# Patient Record
Sex: Male | Born: 1955 | Race: White | Hispanic: No | Marital: Married | Smoking: Never smoker
Health system: Southern US, Community
[De-identification: ages and names within clinical notes are randomized; demographics above are authoritative.]

## PROBLEM LIST (undated history)

## (undated) DIAGNOSIS — R112 Nausea with vomiting, unspecified: Secondary | ICD-10-CM

## (undated) DIAGNOSIS — T8859XA Other complications of anesthesia, initial encounter: Secondary | ICD-10-CM

## (undated) DIAGNOSIS — K219 Gastro-esophageal reflux disease without esophagitis: Secondary | ICD-10-CM

## (undated) DIAGNOSIS — I10 Essential (primary) hypertension: Secondary | ICD-10-CM

## (undated) HISTORY — PX: BACK SURGERY: SHX140

---
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
## 1999-11-28 ENCOUNTER — Encounter: Payer: Self-pay | Admitting: *Deleted

## 1999-11-28 ENCOUNTER — Encounter: Admission: RE | Admit: 1999-11-28 | Discharge: 1999-11-28 | Payer: Self-pay | Admitting: *Deleted

## 2004-11-13 ENCOUNTER — Ambulatory Visit: Payer: Self-pay | Admitting: Internal Medicine

## 2004-11-27 ENCOUNTER — Ambulatory Visit: Payer: Self-pay | Admitting: Internal Medicine

## 2008-11-12 ENCOUNTER — Encounter: Admission: RE | Admit: 2008-11-12 | Discharge: 2008-11-12 | Payer: Self-pay | Admitting: Family Medicine

## 2008-11-27 ENCOUNTER — Ambulatory Visit (HOSPITAL_BASED_OUTPATIENT_CLINIC_OR_DEPARTMENT_OTHER): Admission: RE | Admit: 2008-11-27 | Discharge: 2008-11-27 | Payer: Self-pay | Admitting: Urology

## 2009-11-19 ENCOUNTER — Encounter (INDEPENDENT_AMBULATORY_CARE_PROVIDER_SITE_OTHER): Payer: Self-pay | Admitting: *Deleted

## 2010-06-04 ENCOUNTER — Telehealth: Payer: Self-pay | Admitting: Internal Medicine

## 2010-11-11 NOTE — Letter (Signed)
Summary: Colonoscopy Letter  [facility]  [address] [place], [place] [address]   Phone: [phone]  Fax: [phone]      November 19, 2009 MRN: [id number]   [facility] [address] [place], [place]  [address]   Dear Zachary Wade,   According to your medical record, it is time for you to schedule a Colonoscopy. The American Cancer Society recommends this procedure as a method to detect early colon cancer. Patients with a family history of colon cancer, or a personal history of colon polyps or inflammatory bowel disease are at increased risk.  This letter has beeen generated based on the recommendations made at the time of your procedure. If you feel that in your particular situation this may no longer apply, please contact our office.  Please call our office at [phone] to schedule this appointment or to update your records at your earliest convenience.  Thank you for cooperating with [place] to provide you with the very best care possible.   Sincerely,  Zachary Wade, M.D.  [facility] [phone]

## 2010-11-11 NOTE — Progress Notes (Signed)
Summary: Schedule Colonoscopy  Phone Note Outgoing Call Call back at [facility] Phone [phone]   Summary of Call: called pt he has no regular insurance and if his situation changed he will call back because he knows that it is very important to have his colonoscopy done. Initial call taken by: Harlow Mares CMA [facility]),  June 04, 2010 9:04 AM

## 2011-01-27 LAB — POCT HEMOGLOBIN-HEMACUE: Hemoglobin: 16.3 g/dL (ref 13.0–17.0)

## 2011-02-24 NOTE — Op Note (Signed)
NAME:  Zachary Wade, Zachary Wade                ACCOUNT NO.:  [id number]   MEDICAL RECORD NO.:  [id number]          PATIENT TYPE:  AMB   LOCATION:  [facility]                         FACILITY:  [facility]   PHYSICIAN:  Heloise Purpura, MD      DATE OF BIRTH:  12/18/1955   DATE OF PROCEDURE:  11/27/2008  DATE OF DISCHARGE:                               OPERATIVE REPORT   PREOPERATIVE DIAGNOSIS:  Right ureteral calculus.   POSTOPERATIVE DIAGNOSIS:  Right ureteral calculus.   PROCEDURES:  1. Cystoscopy.  2. Right retrograde pyelography.  3. Right ureteroscopy with laser lithotripsy and stone removal.   ANESTHESIA:  LMA.   INDICATIONS:  [name] is a 55 year old gentleman who presented to his  primary care physician with right-sided flank pain and microscopic  hematuria.  He was found to have a 5-mm distal right ureteral calculus.  He was given a trial spontaneous passage and medical expulsion therapy.  He failed this treatment and, therefore, elected to proceed with  ureteroscopic stone removal.  The potential risks, complications, and  alternative treatment options associated with the above procedure were  discussed in detail, and informed consent was obtained.   DESCRIPTION OF PROCEDURE:  The patient was taken to the operating room  and a general anesthetic was administered.  He was given preoperative  antibiotics, placed in the dorsal lithotomy position, and prepped and  draped in the usual sterile fashion.  Next a preoperative time-out was  performed.  Cystourethroscopy was then performed which demonstrated a  normal anterior and posterior urethra.  The bladder was inspected with  the ureteral orifices noted in the normal anatomic positions  bilaterally.  A systematic survey of the bladder revealed no evidence of  any bladder stones, tumors, or other mucosal pathology.  Attention  turned to the right ureteral orifice which was cannulated with a 6-  French ureteral catheter.  Omnipaque contrast was  injected which  demonstrated a filling defect in the distal right ureter consistent with  the patient's known calculus.  No other filling defects or abnormalities  of the ureter or renal collecting system were identified.  A 0.038  sensor guidewire was then advanced past the stone and up into the renal  pelvis under fluoroscopic guidance.  The short semi-rigid ureteroscope  was then advanced next to the wire into the distal right ureter without  the need for ureteral dilation.  The stone was identified and the 365-  micron laser fiber was inserted through the ureteroscope.  Utilizing the  holmium laser on a setting of 0.8 joules and at a frequency of 6 Hz, the  stone was fragmented.  The stone fragments were then removed with a  nitinol basket and were sent for stone analysis.  Reinspection on  ureteroscopy after stone fragments were removed revealed no remaining  sizable fragments.  Based on the fact that the procedure was very  straight-forward, it was felt that a  ureteral stent would not be necessary.  Therefore, the wire was removed.  All remaining stone fragments were removed from the bladder and the  bladder was emptied.  The patient tolerated the procedure well and  without complications.  He was able to be awakened and transferred to  the recovery unit in satisfactory condition.      Heloise Purpura, MD  Electronically Signed     LB/MEDQ  D:  11/27/2008  T:  11/27/2008  Job:  [id number]

## 2012-10-12 HISTORY — PX: SPINE SURGERY: SHX786

## 2013-08-17 ENCOUNTER — Encounter: Payer: Self-pay | Admitting: Internal Medicine

## 2013-09-26 ENCOUNTER — Ambulatory Visit: Payer: Self-pay

## 2013-09-27 ENCOUNTER — Ambulatory Visit: Payer: Self-pay | Admitting: Physical Therapy

## 2013-09-28 ENCOUNTER — Encounter: Payer: Self-pay | Admitting: Internal Medicine

## 2013-10-11 ENCOUNTER — Encounter: Payer: Self-pay | Admitting: Internal Medicine

## 2014-02-22 ENCOUNTER — Ambulatory Visit: Payer: BC Managed Care – PPO

## 2014-02-22 ENCOUNTER — Ambulatory Visit (INDEPENDENT_AMBULATORY_CARE_PROVIDER_SITE_OTHER): Payer: BC Managed Care – PPO | Admitting: Family Medicine

## 2014-02-22 VITALS — BP 126/84 | HR 76 | Temp 97.9°F | Resp 16 | Ht 68.5 in | Wt 162.8 lb

## 2014-02-22 DIAGNOSIS — M79642 Pain in left hand: Secondary | ICD-10-CM

## 2014-02-22 DIAGNOSIS — M79609 Pain in unspecified limb: Secondary | ICD-10-CM

## 2014-02-22 NOTE — Patient Instructions (Signed)
Let's keep an eye on this problem for a few days.  Try taking ibuprofen 400 mg three times a day for 5 days or so.  If you continue to have symptoms please let me or Dr. Amanda PeaGramig know.    Your x-ray shows some arthritis in your hand but no acute findings.

## 2014-02-22 NOTE — Progress Notes (Signed)
[facility] [address], [place] [id number] [phone]  Date:  02/22/2014   Name:  Zachary MaywoodMichael K Wade   DOB:  [birth year]   MRN:  [id number]  PCP:  Alva GarnetSHELTON,KIMBERLY R., MD    Chief Complaint: Joint Swelling   History of Present Illness:  Zachary Wade is a 58 y.o. very pleasant male patient who presents with the following:  He has a problem with his left hand today for the last  3 days,  It hurts to press on the area He is able to swing a golf club,   Never had this in the past, NKI Otherwise he feels ok.  He is generally very healthy  He is not aware of any family histoyr of gout  There are no active problems to display for this patient.   History reviewed. No pertinent past medical history.  Past Surgical History  Procedure Laterality Date  . Appendectomy  1967  . Spine surgery  2014    C5-C7    History  Substance Use Topics  . Smoking status: Never Smoker   . Smokeless tobacco: Not on file  . Alcohol Use: Not on file    History reviewed. No pertinent family history.  Allergies no known allergies  Medication list has been reviewed and updated.  No current outpatient prescriptions on file prior to visit.   No current facility-administered medications on file prior to visit.    Review of Systems:  As per HPI- otherwise negative.   Physical Examination: Filed Vitals:   02/22/14 0828  BP: 126/84  Pulse: 76  Temp: 97.9 F (36.6 C)  Resp: 16   Filed Vitals:   02/22/14 0828  Height: 5' 8.5" (1.74 m)  Weight: 162 lb 12.8 oz (73.846 kg)   Body mass index is 24.39 kg/(m^2). Ideal Body Weight: Weight in (lb) to have BMI = 25: 166.5  GEN: WDWN, NAD, Non-toxic, A & O x 3, looks well HEENT: Atraumatic, Normocephalic. Neck supple. No masses, No LAD. Ears and Nose: No external deformity. CV: RRR, No M/G/R. No JVD. No thrill. No extra heart sounds. PULM: CTA B, no wheezes, crackles, rhonchi. No retractions. No resp. distress.  No accessory muscle use. EXTR: No c/c/e Left hand: there is minimal swelling and tenderness at the 2nd MCP, on the radial side.  There is a possible small cyst palpable under the skin. No pain with movement of the joints, no weakness or lack of ROM, no heat NEURO Normal gait.  PSYCH: Normally interactive. Conversant. Not depressed or anxious appearing.  Calm demeanor.   [facility] reading (PRIMARY) by  Dr. Patsy Lageropland Left hand: negative for any acute abnormality.  Some degenerative change especially at 1st carpal/ metacarpal joint  LEFT HAND - COMPLETE 3+ VIEW  COMPARISON: None.  FINDINGS: No fracture. No dislocation.  There is a small cyst or possibly an erosion, along the radial margin at the base of the proximal phalanx of the index finger. Remaining metacarpophalangeal joints and the interphalangeal joints are unremarkable. Joint space narrowing, mild subchondral sclerosis and cystic change and small marginal osteophytes are noted at the trapezium first metacarpal articulation consistent with osteoarthritis. Soft tissues are unremarkable.  IMPRESSION: 1. No fracture or acute finding. 2. Small peripheral cyst versus a possible marginal erosion at the radial base of the proximal phalanx of the index finger. No other abnormality at this joint. 3. 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  At this point no evidence of dangerous etiology such as a joint infection.  He will use an anti- inflammatory on a scheduled basis for a few days. If he continues to have any trouble will plan to follow-up with Dr. Amanda PeaGramig who is his hand surgeon  Signed Abbe AmsterdamJessica Fitzgerald Dunne, MD

## 2014-02-25 ENCOUNTER — Encounter: Payer: Self-pay | Admitting: Family Medicine

## 2016-06-12 ENCOUNTER — Other Ambulatory Visit: Payer: Self-pay | Admitting: Urology

## 2016-06-12 NOTE — Progress Notes (Signed)
Pt is being scheduled for preop appt. Please place surgical orders in epic. Thanks.  

## 2016-06-25 ENCOUNTER — Encounter (HOSPITAL_COMMUNITY): Payer: Self-pay

## 2016-06-29 ENCOUNTER — Ambulatory Visit: Admit: 2016-06-29 | Payer: Self-pay | Admitting: Urology

## 2016-06-29 SURGERY — CYSTOURETEROSCOPY, WITH RETROGRADE PYELOGRAM AND STENT INSERTION
Anesthesia: General | Laterality: Left

## 2018-07-11 ENCOUNTER — Other Ambulatory Visit (HOSPITAL_COMMUNITY): Payer: Self-pay | Admitting: Cardiology

## 2018-07-11 DIAGNOSIS — R9430 Abnormal result of cardiovascular function study, unspecified: Secondary | ICD-10-CM

## 2018-07-11 DIAGNOSIS — R55 Syncope and collapse: Secondary | ICD-10-CM

## 2018-07-21 ENCOUNTER — Ambulatory Visit (HOSPITAL_COMMUNITY)
Admission: RE | Admit: 2018-07-21 | Discharge: 2018-07-21 | Disposition: A | Discharge status: Home / Self Care | Payer: BLUE CROSS/BLUE SHIELD | Source: Ambulatory Visit | Attending: Cardiology | Admitting: Cardiology

## 2018-07-21 DIAGNOSIS — R9430 Abnormal result of cardiovascular function study, unspecified: Secondary | ICD-10-CM | POA: Insufficient documentation

## 2018-07-21 DIAGNOSIS — R55 Syncope and collapse: Secondary | ICD-10-CM | POA: Insufficient documentation

## 2018-07-21 MED ORDER — METOPROLOL TARTRATE 5 MG/5ML IV SOLN
INTRAVENOUS | Status: AC
  Administered 2018-07-21: 5 mg via INTRAVENOUS
  Filled 2018-07-21: qty 20

## 2018-07-21 MED ORDER — NITROGLYCERIN 0.4 MG SL SUBL
0.8000 mg | SUBLINGUAL_TABLET | Freq: Once | SUBLINGUAL | Status: AC
  Administered 2018-07-21: 0.8 mg via SUBLINGUAL
  Filled 2018-07-21: qty 25

## 2018-07-21 MED ORDER — NITROGLYCERIN 0.4 MG SL SUBL
SUBLINGUAL_TABLET | SUBLINGUAL | Status: AC
  Filled 2018-07-21: qty 1

## 2018-07-21 MED ORDER — NITROGLYCERIN 0.4 MG SL SUBL
SUBLINGUAL_TABLET | SUBLINGUAL | Status: AC
  Administered 2018-07-21: 0.8 mg via SUBLINGUAL
  Filled 2018-07-21: qty 2

## 2018-07-21 MED ORDER — IOPAMIDOL (ISOVUE-370) INJECTION 76%
100.0000 mL | Freq: Once | INTRAVENOUS | Status: AC | PRN
  Administered 2018-07-21: 80 mL via INTRAVENOUS

## 2018-07-21 MED ORDER — METOPROLOL TARTRATE 5 MG/5ML IV SOLN
5.0000 mg | INTRAVENOUS | Status: DC | PRN
  Administered 2018-07-21: 5 mg via INTRAVENOUS
  Filled 2018-07-21: qty 5

## 2019-12-20 ENCOUNTER — Ambulatory Visit: Payer: Self-pay | Admitting: Surgery

## 2019-12-20 NOTE — H&P (Signed)
History of Present Illness Zachary Wade. Adelle Zachar MD; 12/20/2019 5:58 PM) The patient is a 64 year old male who presents with an inguinal hernia. PCP - [facility]: left inguinal hernia  This is a 64 year old male in reasonably good health who presents with recent history of left groin pain. The patient was bending over and felt a pop in his left groin. He has had some discomfort in this area. Subsequently this has developed into a bulge in the left groin. He remains reducible. He has not noticed any change in his bowel movements. He does not have any pain or discomfort in the right groin or in the umbilicus. He self referred for surgical evaluation.  The patient has had previous cervical spine surgery. He states that when he had general anesthesia last time, he had significant problems with urinary retention. He is quite concerned about this.    Problem List/Past Medical Zachary Hazard K. 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 Zachary Lucien, MD; 12/20/2019 5:58 PM) LORazepam (1MG  Tablet, Oral) Active. amLODIPine Besylate (5MG  Tablet, Oral) Active. HYDROcodone-Acetaminophen (10-325MG  Tablet, Oral) Active. Medications Reconciled Flomax (0.4MG  Capsule, 1 (one) Oral daily, Taken starting 12/20/2019) Active.  Social History , CMA; 12/20/2019 2:43 PM) Caffeine use Coffee. No alcohol use No drug use Tobacco use Never smoker.  Family History Zachary Wade, CMA; 12/20/2019 2:43 PM) Family history unknown First Degree Relatives  Other Problems Zachary Wade. 02/19/2020, MD; 12/20/2019 5:58 PM) Kidney  [name]     Review of Systems 02/19/2020 CMA; 12/20/2019 2:43 PM) HEENT Present- Hearing Loss and Ringing in the Ears. Not Present- Earache, Hoarseness, Nose Bleed, Oral Ulcers, Seasonal Allergies, Sinus Pain, Sore Throat, Visual Disturbances, Wears glasses/contact lenses and Yellow Eyes. Gastrointestinal Present- Abdominal Pain. Not Present- Bloating, Bloody Stool, Change in Bowel Habits, Chronic diarrhea, Constipation, Difficulty Swallowing, Excessive gas, Gets full quickly at meals, Hemorrhoids, Indigestion, Nausea, Rectal Pain and Vomiting. Neurological Present- Headaches. Not Present- Decreased Memory, Fainting, Numbness, Seizures, Tingling, Tremor, Trouble walking and Weakness.  Vitals Zachary Wade CMA; 12/20/2019 2:46 PM) 12/20/2019 2:45 PM Weight: 159 lb Height: 69in Body Surface Area: 1.87 m Body Mass Index: 23.48 kg/m  Temp.: 17F(Tympanic)  Pulse: 90 (Regular)  BP: 130/82 (Sitting, Left Arm, Standard)        Physical Exam 02/19/2020 K. Kameron Blethen MD; 12/20/2019 6:00 PM)  The physical exam findings are as follows: Note:Constitutional: WDWN in NAD, conversant, no obvious deformities; Eyes: Pupils equal, round; sclera anicteric; moist conjunctiva; no lid lag HENT: Oral mucosa moist; good dentition Neck: No masses palpated, trachea midline; no thyromegaly Lungs: CTA bilaterally; normal respiratory effort CV: Regular rate and rhythm; no murmurs; extremities well-perfused with no edema Abd: +bowel sounds, soft, non-tender, no palpable organomegaly; small partially reducible asymptomatic umbilical hernia GU; bilateral descended testes; no testicular masses; bilateral inguinal hernias L >R. Both reducible. Musc: Normal gait; no apparent clubbing or cyanosis in extremities Lymphatic: No palpable cervical or axillary lymphadenopathy Skin: Warm, dry; no sign of jaundice Psychiatric - alert and oriented x 4; calm mood and affect    Assessment & Plan Zachary Hazard K. 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 The surgical procedure has been discussed with the patient. Potential risks, benefits, alternative treatments, and expected outcomes have been explained. All of the patient's questions at this time have been answered. The likelihood of reaching the patient's treatment goal is good. The patient understand the proposed surgical procedure and wishes to proceed.  HISTORY OF URINARY RETENTION (Z87.898)  Current Plans Started Flomax 0.4 MG Oral Capsule, 1 (one) Capsule daily, #10, 10 days starting 12/20/2019, No Refill. Note:We will start Flomax one week prior to surgery and continue for 3 days post-op to hopefully avoid urinary retention.  Imogene Burn. Georgette Dover, MD, [facility]  General/ Trauma Surgery   12/20/2019 6:00 PM

## 2020-01-31 ENCOUNTER — Other Ambulatory Visit (HOSPITAL_COMMUNITY): Payer: Self-pay | Admitting: Internal Medicine

## 2020-01-31 DIAGNOSIS — R0989 Other specified symptoms and signs involving the circulatory and respiratory systems: Secondary | ICD-10-CM

## 2020-02-01 ENCOUNTER — Encounter (HOSPITAL_COMMUNITY): Payer: BLUE CROSS/BLUE SHIELD

## 2020-02-01 ENCOUNTER — Other Ambulatory Visit: Payer: Self-pay

## 2020-02-01 ENCOUNTER — Ambulatory Visit (HOSPITAL_COMMUNITY)
Admission: RE | Admit: 2020-02-01 | Discharge: 2020-02-01 | Disposition: A | Discharge status: Home / Self Care | Payer: Medicare Other | Source: Ambulatory Visit | Attending: Surgery | Admitting: Surgery

## 2020-02-01 DIAGNOSIS — R0989 Other specified symptoms and signs involving the circulatory and respiratory systems: Secondary | ICD-10-CM | POA: Diagnosis present

## 2020-06-27 ENCOUNTER — Other Ambulatory Visit: Payer: Self-pay

## 2020-06-27 ENCOUNTER — Other Ambulatory Visit: Payer: Self-pay | Admitting: *Deleted

## 2020-06-27 ENCOUNTER — Other Ambulatory Visit: Payer: Medicare Other

## 2020-06-27 DIAGNOSIS — Z20822 Contact with and (suspected) exposure to covid-19: Secondary | ICD-10-CM

## 2020-06-28 ENCOUNTER — Other Ambulatory Visit: Payer: Medicare Other

## 2020-06-29 LAB — NOVEL CORONAVIRUS, NAA: SARS-CoV-2, NAA: NOT DETECTED

## 2020-06-29 LAB — SARS-COV-2, NAA 2 DAY TAT

## 2020-06-29 LAB — SPECIMEN STATUS REPORT

## 2020-07-24 IMAGING — CT CT HEART MORP W/ CTA COR W/ SCORE W/ CA W/CM &/OR W/O CM
4 of 7 series · 8 of 20 positions shown, 9 images · IV contrast (APPLIED)
Comparison: None.

EXAM:
OVER-READ INTERPRETATION  CT CHEST

The following report is an over-read performed by radiologist Dr.
Lamberto Kumari [REDACTED] on 07/21/2018. This over-read
does not include interpretation of cardiac or coronary anatomy or
pathology. The coronary CTA interpretation by the cardiologist is
attached.
CLINICAL DATA: Chest pain
Cardiac CTA
MEDICATIONS:
Sub lingual nitro. 4mg x 2
TECHNIQUE: The patient was scanned on a Siemens [REDACTED]ice scanner. Gantry
rotation speed was 250 msecs. Collimation was 0.6 mm. A 100 kV
prospective scan was triggered in the ascending thoracic aorta at
35-75% of the R-R interval. Average HR during the scan was 60 bpm.
The 3D data set was interpreted on a dedicated work station using
MPR, MIP and VRT modes. A total of 80cc of contrast was used.

[Series 6: best diast 74 % · axial · 0.33mm/px · z∈[-339,-298]mm · 2 of 313 slices shown, 3 images]
[im 105/313  vessel]
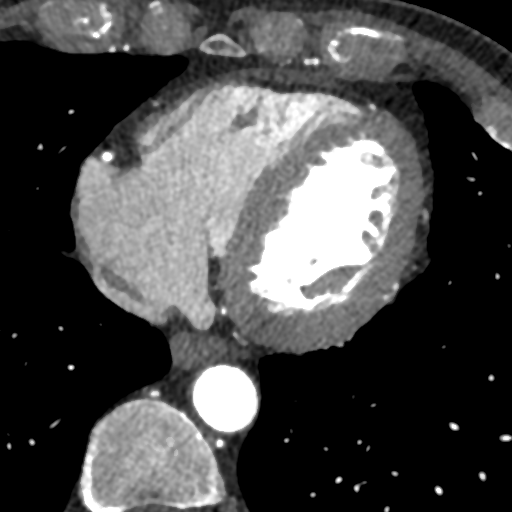
[im 105/313  lung]
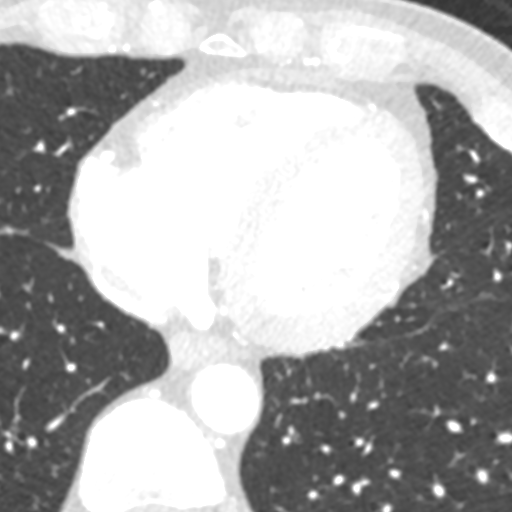
[im 209/313  vessel]
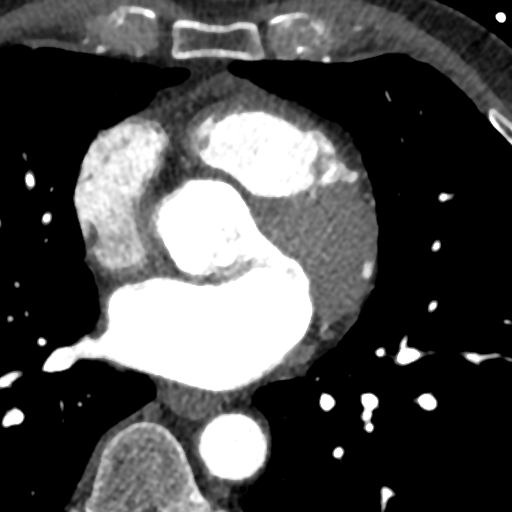

[Series 7: best syst 34 % · axial · 0.33mm/px · z∈[-339,-298]mm · 2 of 313 slices shown]
[im 105/313  vessel]
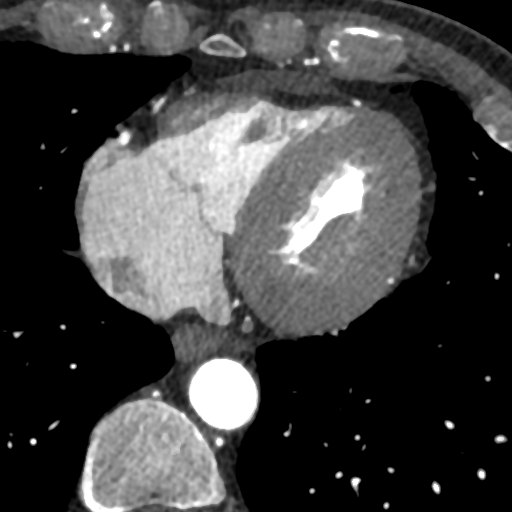
[im 209/313  vessel]
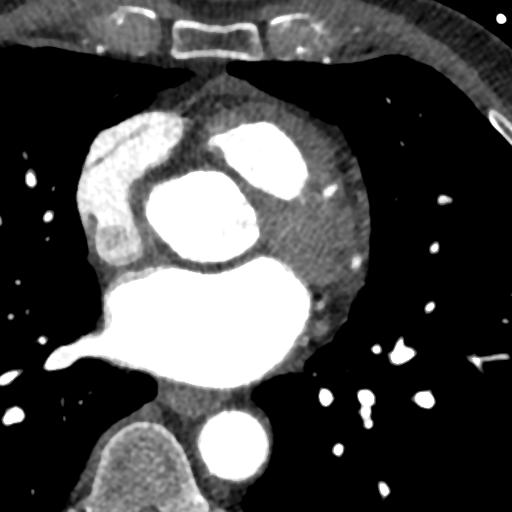

[Series 8: ts diast sharp 74 % · axial · 0.33mm/px · z∈[-339,-298]mm · 2 of 313 slices shown]
[im 105/313  lung]
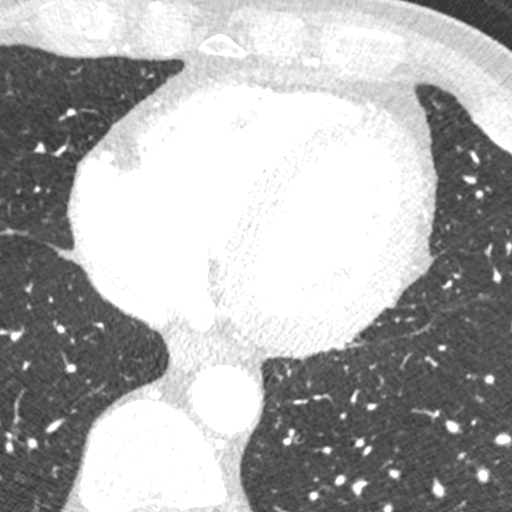
[im 209/313  lung]
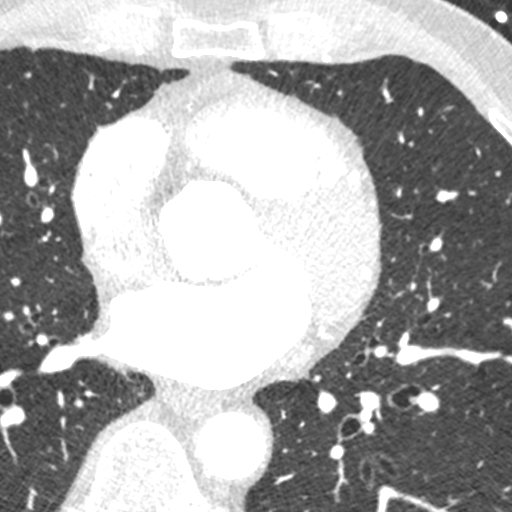

[Series 9: ts syst sharp 34 % · axial · 0.33mm/px · z∈[-339,-298]mm · 2 of 313 slices shown]
[im 105/313  lung]
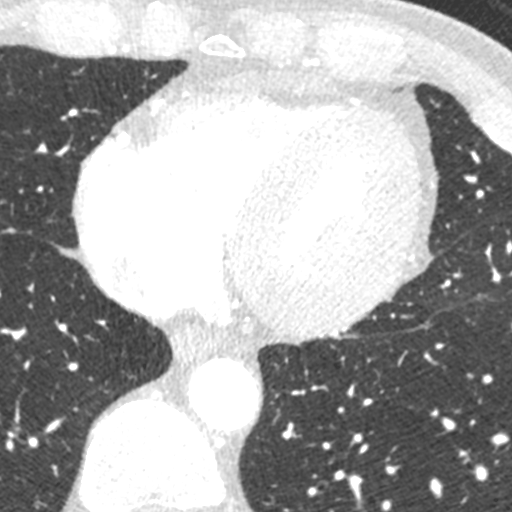
[im 209/313  lung]
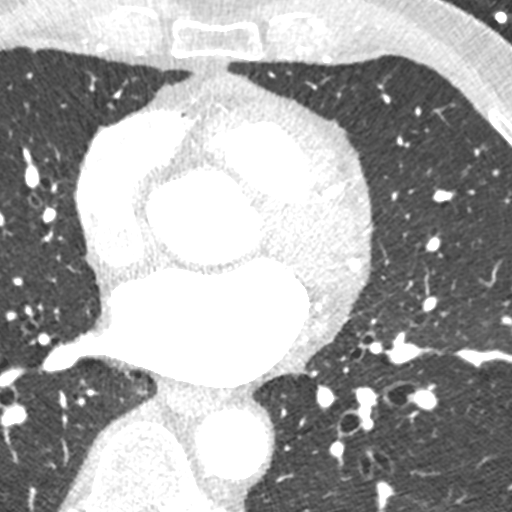

[8 of 20 positions shown; findings below may reference images not displayed]

FINDINGS: Heart is normal size. Visualized aorta is normal caliber. No
adenopathy in the lower mediastinum or hila. Visualized lungs clear.
No effusions.

Imaging into the upper abdomen shows no acute findings. Chest wall
soft tissues are unremarkable. No acute bony abnormality.
IMPRESSION: No acute or significant extracardiac abnormality.
FINDINGS: Non-cardiac: See separate report from [REDACTED].

Pulmonary veins drain normally to the left atrium.

Calcium Score: No plaque or stenosis.

Coronary Arteries: Right dominant with no anomalies

LM: No plaque or stenosis.

LAD system: No plaque or stenosis.

Circumflex system: Large ramus, relatively small AV LCx. No plaque
or stenosis.

RCA system: No plaque or stenosis.
IMPRESSION: 1. Coronary artery calcium score 0 Agatston units, suggesting low
risk for future cardiac events.

2.  No significant coronary disease noted on CT angiography.

Bernadine Bazan

## 2020-10-15 ENCOUNTER — Other Ambulatory Visit: Payer: Self-pay | Admitting: Gastroenterology

## 2020-10-15 DIAGNOSIS — K261 Acute duodenal ulcer with perforation: Secondary | ICD-10-CM

## 2020-10-15 DIAGNOSIS — K315 Obstruction of duodenum: Secondary | ICD-10-CM

## 2020-10-15 DIAGNOSIS — R140 Abdominal distension (gaseous): Secondary | ICD-10-CM

## 2020-10-15 DIAGNOSIS — R109 Unspecified abdominal pain: Secondary | ICD-10-CM

## 2020-10-25 ENCOUNTER — Ambulatory Visit
Admission: RE | Admit: 2020-10-25 | Discharge: 2020-10-25 | Disposition: A | Discharge status: Home / Self Care | Payer: Medicare Other | Source: Ambulatory Visit | Attending: Gastroenterology | Admitting: Gastroenterology

## 2020-10-25 ENCOUNTER — Other Ambulatory Visit: Payer: Self-pay

## 2020-10-25 DIAGNOSIS — R109 Unspecified abdominal pain: Secondary | ICD-10-CM

## 2020-10-25 DIAGNOSIS — K315 Obstruction of duodenum: Secondary | ICD-10-CM

## 2020-10-25 DIAGNOSIS — K261 Acute duodenal ulcer with perforation: Secondary | ICD-10-CM

## 2020-10-25 DIAGNOSIS — R140 Abdominal distension (gaseous): Secondary | ICD-10-CM

## 2020-10-25 MED ORDER — IOPAMIDOL (ISOVUE-300) INJECTION 61%
100.0000 mL | Freq: Once | INTRAVENOUS | Status: AC | PRN
  Administered 2020-10-25: 100 mL via INTRAVENOUS

## 2020-11-05 ENCOUNTER — Other Ambulatory Visit: Payer: Self-pay | Admitting: Gastroenterology

## 2020-11-06 ENCOUNTER — Other Ambulatory Visit: Payer: Self-pay

## 2020-11-06 ENCOUNTER — Encounter (HOSPITAL_COMMUNITY): Payer: Self-pay | Admitting: Gastroenterology

## 2020-11-09 ENCOUNTER — Other Ambulatory Visit (HOSPITAL_COMMUNITY)
Admission: RE | Admit: 2020-11-09 | Discharge: 2020-11-09 | Disposition: A | Discharge status: Home / Self Care | Payer: Medicare Other | Source: Ambulatory Visit | Attending: Gastroenterology | Admitting: Gastroenterology

## 2020-11-09 DIAGNOSIS — Z20822 Contact with and (suspected) exposure to covid-19: Secondary | ICD-10-CM | POA: Insufficient documentation

## 2020-11-09 DIAGNOSIS — Z01812 Encounter for preprocedural laboratory examination: Secondary | ICD-10-CM | POA: Diagnosis present

## 2020-11-09 LAB — SARS CORONAVIRUS 2 (TAT 6-24 HRS): SARS Coronavirus 2: NEGATIVE

## 2020-11-13 ENCOUNTER — Encounter (HOSPITAL_COMMUNITY): Payer: Self-pay | Admitting: Gastroenterology

## 2020-11-13 ENCOUNTER — Other Ambulatory Visit: Payer: Self-pay

## 2020-11-13 ENCOUNTER — Ambulatory Visit (HOSPITAL_COMMUNITY)
Admission: RE | Admit: 2020-11-13 | Discharge: 2020-11-13 | Disposition: A | Discharge status: Home / Self Care | Payer: Medicare Other | Attending: Gastroenterology | Admitting: Gastroenterology

## 2020-11-13 ENCOUNTER — Ambulatory Visit (HOSPITAL_COMMUNITY): Payer: Medicare Other

## 2020-11-13 ENCOUNTER — Ambulatory Visit (HOSPITAL_COMMUNITY): Payer: Medicare Other | Admitting: Anesthesiology

## 2020-11-13 ENCOUNTER — Encounter (HOSPITAL_COMMUNITY)
Admission: RE | Disposition: A | Discharge status: Home / Self Care | Payer: Self-pay | Source: Home / Self Care | Attending: Gastroenterology

## 2020-11-13 DIAGNOSIS — R6881 Early satiety: Secondary | ICD-10-CM | POA: Insufficient documentation

## 2020-11-13 DIAGNOSIS — R112 Nausea with vomiting, unspecified: Secondary | ICD-10-CM | POA: Insufficient documentation

## 2020-11-13 DIAGNOSIS — Z79899 Other long term (current) drug therapy: Secondary | ICD-10-CM | POA: Diagnosis not present

## 2020-11-13 DIAGNOSIS — K315 Obstruction of duodenum: Secondary | ICD-10-CM | POA: Diagnosis not present

## 2020-11-13 DIAGNOSIS — K2980 Duodenitis without bleeding: Secondary | ICD-10-CM | POA: Diagnosis not present

## 2020-11-13 HISTORY — PX: BIOPSY: SHX5522

## 2020-11-13 HISTORY — PX: ESOPHAGOGASTRODUODENOSCOPY (EGD) WITH PROPOFOL: SHX5813

## 2020-11-13 HISTORY — PX: BALLOON DILATION: SHX5330

## 2020-11-13 HISTORY — DX: Gastro-esophageal reflux disease without esophagitis: K21.9

## 2020-11-13 HISTORY — DX: Nausea with vomiting, unspecified: R11.2

## 2020-11-13 SURGERY — ESOPHAGOGASTRODUODENOSCOPY (EGD) WITH PROPOFOL
Anesthesia: Monitor Anesthesia Care

## 2020-11-13 MED ORDER — LACTATED RINGERS IV SOLN: INTRAVENOUS | Status: DC | PRN

## 2020-11-13 MED ORDER — PROPOFOL 10 MG/ML IV BOLUS
INTRAVENOUS | Status: DC | PRN
  Administered 2020-11-13: 40 mg via INTRAVENOUS
  Administered 2020-11-13: 20 mg via INTRAVENOUS

## 2020-11-13 MED ORDER — SODIUM CHLORIDE 0.9 % IV SOLN
INTRAVENOUS | Status: DC | PRN
  Administered 2020-11-13: 30 mL

## 2020-11-13 MED ORDER — LIDOCAINE HCL (CARDIAC) PF 100 MG/5ML IV SOSY
PREFILLED_SYRINGE | INTRAVENOUS | Status: DC | PRN
  Administered 2020-11-13: 100 mg via INTRAVENOUS

## 2020-11-13 MED ORDER — PROPOFOL 500 MG/50ML IV EMUL
INTRAVENOUS | Status: DC | PRN
  Administered 2020-11-13: 125 ug/kg/min via INTRAVENOUS

## 2020-11-13 SURGICAL SUPPLY — 14 items

## 2020-11-13 NOTE — Anesthesia Postprocedure Evaluation (Signed)
Anesthesia Post Note  Patient: Zachary Wade  Procedure(s) Performed: ESOPHAGOGASTRODUODENOSCOPY (EGD) WITH PROPOFOL (N/A ) BALLOON DILATION (N/A ) BIOPSY     Patient location during evaluation: Endoscopy Anesthesia Type: MAC Level of consciousness: awake and alert Pain management: pain level controlled Vital Signs Assessment: post-procedure vital signs reviewed and stable Respiratory status: spontaneous breathing, nonlabored ventilation and respiratory function stable Cardiovascular status: blood pressure returned to baseline and stable Postop Assessment: no apparent nausea or vomiting Anesthetic complications: no   No complications documented.  Last Vitals:  Vitals:   11/13/20 1120 11/13/20 1130  BP: 138/84 (!) 143/92  Pulse: 83 76  Resp: 15 18  Temp:    SpO2: 99% 99%    Last Pain:  Vitals:   11/13/20 1130  TempSrc:   PainSc: 0-No pain                 Lidia Collum

## 2020-11-13 NOTE — H&P (Signed)
[facility] Admission Note  Chief Complaint: nausea, vomiting, early satiety. HPI: Zachary Wade is an 65 y.o. male.  Has nausea, vomiting, early satiety.  Proximal duodenal stricture.  No weight loss.  CT scan without obvious evidence of malignancy.  Presents for endoscopy and possible duodenal stricture dilatation.  Past Medical History:  Diagnosis Date  . GERD (gastroesophageal reflux disease)   . PONV (postoperative nausea and vomiting)     Past Surgical History:  Procedure Laterality Date  . APPENDECTOMY  1967  . BACK SURGERY     fusion c4,5,6  . SPINE SURGERY  2014   C5-C7    Medications Prior to Admission  Medication Sig Dispense Refill  . amLODipine (NORVASC) 5 MG tablet Take 5 mg by mouth daily.    Marland Kitchen HYDROcodone-acetaminophen (NORCO/VICODIN) 5-325 MG per tablet Take 1 tablet by mouth 3 (three) times daily.    Marland Kitchen LORazepam (ATIVAN) 1 MG tablet Take 1 mg by mouth 2 (two) times daily.    . pantoprazole (PROTONIX) 40 MG tablet Take 40 mg by mouth daily.    . sucralfate (CARAFATE) 1 g tablet Take 1 g by mouth 3 (three) times daily.    Marland Kitchen zinc gluconate 50 MG tablet Take 50 mg by mouth daily.      Allergies: No Known Allergies  History reviewed. No pertinent family history.  Social History:  reports that he has never smoked. He has never used smokeless tobacco. He reports previous alcohol use. He reports that he does not use drugs.   ROS: As per HPI, all others negative   Blood pressure (!) 166/84, pulse 82, temperature 98.4 F (36.9 C), resp. rate 16, height 5\' 9"  (1.753 m), weight 72.6 kg, SpO2 100 %. General appearance: NAD HEENT:  [place], AT CV:  RRR LUNGS:  CTA SKIN:   No jaundice ABD:  Soft, non tender  No results found for this or any previous visit (from the past 48 hour(s)). No results found.  Assessment/Plan  1.  Nausea, vomiting. 2.  Early satiety. 3.  Proximal duodenal stricture. 4.  Plan for endoscopy with possible duodenal stricture  dilatation.  Fluoroscopy available. 5.  Risks (bleeding, infection, bowel perforation that could require surgery, sedation-related changes in cardiopulmonary systems), benefits (identification and possible treatment of source of symptoms, exclusion of certain causes of symptoms), and alternatives (watchful waiting, radiographic imaging studies, empiric medical treatment) of upper endoscopy (EGD) were explained to patient/family in detail and patient wishes to proceed.  11/13/2020, 9:52 AM

## 2020-11-13 NOTE — Op Note (Signed)
[facility] Patient Name: Zachary Wade Procedure Date: 11/13/2020 MRN: [id number] Attending MD: Willis Modena , MD Date of Birth: 1956-01-22 CSN: [id number] Age: 65 Admit Type: Outpatient Procedure:                Upper GI endoscopy Indications:              Stenosis of the duodenum, For therapy of duodenal                            stenosis, Early satiety, Nausea with vomiting Providers:                Willis Modena, MD, Dwain Sarna, RN, Michele Mcalpine Technician Referring MD:              Medicines:                Monitored Anesthesia Care Complications:            No immediate complications. Estimated Blood Loss:     Estimated blood loss was minimal. Procedure:                Pre-Anesthesia Assessment:                           - Prior to the procedure, a History and Physical                            was performed, and patient medications and                            allergies were reviewed. The patient's tolerance of                            previous anesthesia was also reviewed. The risks                            and benefits of the procedure and the sedation                            options and risks were discussed with the patient.                            All questions were answered, and informed consent                            was obtained. Prior Anticoagulants: The patient has                            taken no previous anticoagulant or antiplatelet                            agents. ASA Grade Assessment: II - A patient with  mild systemic disease. After reviewing the risks                            and benefits, the patient was deemed in                            satisfactory condition to undergo the procedure.                           After obtaining informed consent, the endoscope was                            passed under direct vision. Throughout the                             procedure, the patient's blood pressure, pulse, and                            oxygen saturations were monitored continuously. The                            GIF-H190 ([id number]) Olympus gastroscope was                            introduced through the mouth, and advanced to the                            second part of duodenum. The upper GI endoscopy was                            accomplished without difficulty. The patient                            tolerated the procedure well. Scope In: Scope Out: Findings:      The examined esophagus was normal.      A small amount of food (residue) was found in the gastric fundus and in       the gastric body.      The exam of the stomach was otherwise normal.      A widely patent pyloric sphincter noted.      An acquired benign-appearing, intrinsic severe stenosis was found in the       first portion of the duodenum and was traversed with the 5.69mm pediatric       endoscope (revealing a normal distal D1 and D2), but not the diagnostic       61mm endoscope. A TTS dilator was passed through the diagnostic       endoscope. Dilation with a 6-7-8 mm balloon and an 05-20-09 mm balloon       dilator was performed to 10 mm. This was biopsied with a cold forceps       for histology.      The exam of the duodenum was otherwise normal. Impression:               - Normal esophagus.                           -  A small amount of food (residue) in the stomach.                           - Deformity in the pylorus.                           - Acquired duodenal stenosis. Dilated. Biopsied. Moderate Sedation:      None Recommendation:           - Patient has a contact number available for                            emergencies. The signs and symptoms of potential                            delayed complications were discussed with the                            patient. Return to normal activities tomorrow.                            Written discharge instructions  were provided to the                            patient.                           - Discharge patient to home (via wheelchair).                           - Mechanical soft diet indefinitely.                           - Continue present medications.                           - Await pathology results.                           - Repeat upper endoscopy in 1 month for retreatment.                           - Return to GI clinic after studies are complete.                           - Return to referring physician as previously                            scheduled. Procedure Code(s):        --- Professional ---                           [phone], Esophagogastroduodenoscopy, flexible,                            transoral; with dilation of gastric/duodenal  stricture(s) (eg, balloon, bougie)                           43239, 59, Esophagogastroduodenoscopy, flexible,                            transoral; with biopsy, single or multiple Diagnosis Code(s):        --- Professional ---                           K31.89, Other diseases of stomach and duodenum                           K31.5, Obstruction of duodenum                           R68.81, Early satiety                           R11.2, Nausea with vomiting, unspecified CPT copyright 2019 [employer]. All rights reserved. The codes documented in this report are preliminary and upon coder review may  be revised to meet current compliance requirements. Willis Modena, MD 11/13/2020 10:55:41 AM This report has been signed electronically. Number of Addenda: 0

## 2020-11-13 NOTE — Discharge Instructions (Signed)
YOU HAD AN ENDOSCOPIC PROCEDURE TODAY: Refer to the procedure report and other information in the discharge instructions given to you for any specific questions about what was found during the examination. If this information does not answer your questions, please call [facility] office at [phone] to clarify.   YOU SHOULD EXPECT: Some feelings of bloating in the abdomen. Passage of more gas than usual. Walking can help get rid of the air that was put into your GI tract during the procedure and reduce the bloating. If you had a lower endoscopy (such as a colonoscopy or flexible sigmoidoscopy) you may notice spotting of blood in your stool or on the toilet paper. Some abdominal soreness may be present for a day or two, also.  DIET: Continue soft, mechanical diet, until otherwise specified. Drink plenty of fluids but you should avoid alcoholic beverages for 24 hours. If you had a esophageal dilation, please see attached instructions for diet.    ACTIVITY: Your care partner should take you home directly after the procedure. You should plan to take it easy, moving slowly for the rest of the day. You can resume normal activity the day after the procedure however YOU SHOULD NOT DRIVE, use power tools, machinery or perform tasks that involve climbing or major physical exertion for 24 hours (because of the sedation medicines used during the test).   SYMPTOMS TO REPORT IMMEDIATELY: A gastroenterologist can be reached at any hour. Please call [phone]  for any of the following symptoms:   Following lower endoscopy (colonoscopy, flexible sigmoidoscopy) Excessive amounts of blood in the stool  Significant tenderness, worsening of abdominal pains  Swelling of the abdomen that is new, acute  Fever of 100 or higher   Following upper endoscopy (EGD, EUS, ERCP, esophageal dilation) Vomiting of blood or coffee ground material  New, significant abdominal pain  New, significant chest pain or pain under the  shoulder blades  Painful or persistently difficult swallowing  New shortness of breath  Black, tarry-looking or red, bloody stools  FOLLOW UP:  If any biopsies were taken you will be contacted by phone or by letter within the next 1-3 weeks. Call [phone]  if you have not heard about the biopsies in 3 weeks.  Please also call with any specific questions about appointments or follow up tests. YOU HAD AN ENDOSCOPIC PROCEDURE TODAY: Refer to the procedure report and other information in the discharge instructions given to you for any specific questions about what was found during the examination. If this information does not answer your questions, please call [facility] office at [phone] to clarify.   YOU SHOULD EXPECT: Some feelings of bloating in the abdomen. Passage of more gas than usual. Walking can help get rid of the air that was put into your GI tract during the procedure and reduce the bloating. If you had a lower endoscopy (such as a colonoscopy or flexible sigmoidoscopy) you may notice spotting of blood in your stool or on the toilet paper. Some abdominal soreness may be present for a day or two, also.  DIET: Your first meal following the procedure should be a light meal and then it is ok to progress to your normal diet. A half-sandwich or bowl of soup is an example of a good first meal. Heavy or fried foods are harder to digest and may make you feel nauseous or bloated. Drink plenty of fluids but you should avoid alcoholic beverages for 24 hours. If you had a esophageal dilation, please see  attached instructions for diet.    ACTIVITY: Your care partner should take you home directly after the procedure. You should plan to take it easy, moving slowly for the rest of the day. You can resume normal activity the day after the procedure however YOU SHOULD NOT DRIVE, use power tools, machinery or perform tasks that involve climbing or major physical exertion for 24 hours (because of the  sedation medicines used during the test).   SYMPTOMS TO REPORT IMMEDIATELY: A gastroenterologist can be reached at any hour. Please call [phone]  for any of the following symptoms:   Following upper endoscopy (EGD, EUS, ERCP, esophageal dilation) Vomiting of blood or coffee ground material  New, significant abdominal pain  New, significant chest pain or pain under the shoulder blades  Painful or persistently difficult swallowing  New shortness of breath  Black, tarry-looking or red, bloody stools  FOLLOW UP:  If any biopsies were taken you will be contacted by phone or by letter within the next 1-3 weeks. Call [phone]  if you have not heard about the biopsies in 3 weeks.  Please also call with any specific questions about appointments or follow up tests.

## 2020-11-13 NOTE — Transfer of Care (Signed)
Immediate Anesthesia Transfer of Care Note  Patient: Zachary Wade  Procedure(s) Performed: ESOPHAGOGASTRODUODENOSCOPY (EGD) WITH PROPOFOL (N/A ) BALLOON DILATION (N/A ) BIOPSY  Patient Location: PACU and Endoscopy Unit  Anesthesia Type:MAC  Level of Consciousness: awake, alert  and oriented  Airway & Oxygen Therapy: Patient Spontanous Breathing  Post-op Assessment: Report given to RN and Post -op Vital signs reviewed and stable  Post vital signs: Reviewed and stable  Last Vitals:  Vitals Value Taken Time  BP    Temp    Pulse 91 11/13/20 1053  Resp 16 11/13/20 1053  SpO2 95 % 11/13/20 1053  Vitals shown include unvalidated device data.  Last Pain:  Vitals:   11/13/20 0855  PainSc: 0-No pain         Complications: No complications documented.

## 2020-11-13 NOTE — Anesthesia Preprocedure Evaluation (Addendum)
Anesthesia Evaluation  Patient identified by MRN, date of birth, ID band Patient awake    Reviewed: Allergy & Precautions, NPO status , Patient's Chart, lab work & pertinent test results  History of Anesthesia Complications Negative for: history of anesthetic complications  Airway Mallampati: II  TM Distance: >3 FB Neck ROM: Full    Dental  (+) Teeth Intact   Pulmonary neg pulmonary ROS,    Pulmonary exam normal        Cardiovascular hypertension, Pt. on medications Normal cardiovascular exam     Neuro/Psych negative psych ROS   GI/Hepatic Neg liver ROS, GERD  ,dysphagia   Endo/Other  negative endocrine ROS  Renal/GU negative Renal ROS  negative genitourinary   Musculoskeletal negative musculoskeletal ROS (+)   Abdominal   Peds  Hematology negative hematology ROS (+)   Anesthesia Other Findings   Reproductive/Obstetrics                            Anesthesia Physical Anesthesia Plan  ASA: II  Anesthesia Plan: MAC   Post-op Pain Management:    Induction: Intravenous  PONV Risk Score and Plan: 1 and Propofol infusion, TIVA and Treatment may vary due to age or medical condition  Airway Management Planned: Natural Airway, Nasal Cannula and Simple Face Mask  Additional Equipment: None  Intra-op Plan:   Post-operative Plan:   Informed Consent: I have reviewed the patients History and Physical, chart, labs and discussed the procedure including the risks, benefits and alternatives for the proposed anesthesia with the patient or authorized representative who has indicated his/her understanding and acceptance.       Plan Discussed with:   Anesthesia Plan Comments:         Anesthesia Quick Evaluation

## 2020-11-14 ENCOUNTER — Encounter (HOSPITAL_COMMUNITY): Payer: Self-pay | Admitting: Gastroenterology

## 2020-11-14 LAB — SURGICAL PATHOLOGY

## 2020-12-03 ENCOUNTER — Other Ambulatory Visit: Payer: Self-pay | Admitting: Gastroenterology

## 2020-12-04 ENCOUNTER — Encounter (HOSPITAL_COMMUNITY): Payer: Self-pay | Admitting: Gastroenterology

## 2020-12-05 ENCOUNTER — Other Ambulatory Visit: Payer: Self-pay

## 2020-12-05 NOTE — Progress Notes (Signed)
Attempted to obtain medical history via telephone, unable to reach at this time. I left a voicemail to return pre surgical testing department's phone call.  

## 2020-12-09 ENCOUNTER — Other Ambulatory Visit (HOSPITAL_COMMUNITY)
Admission: RE | Admit: 2020-12-09 | Discharge: 2020-12-09 | Disposition: A | Discharge status: Home / Self Care | Payer: Medicare Other | Source: Ambulatory Visit | Attending: Gastroenterology | Admitting: Gastroenterology

## 2020-12-09 DIAGNOSIS — Z01812 Encounter for preprocedural laboratory examination: Secondary | ICD-10-CM | POA: Diagnosis present

## 2020-12-09 DIAGNOSIS — Z20822 Contact with and (suspected) exposure to covid-19: Secondary | ICD-10-CM | POA: Insufficient documentation

## 2020-12-09 LAB — SARS CORONAVIRUS 2 (TAT 6-24 HRS): SARS Coronavirus 2: NEGATIVE

## 2020-12-11 ENCOUNTER — Ambulatory Visit (HOSPITAL_COMMUNITY)
Admission: RE | Admit: 2020-12-11 | Discharge: 2020-12-11 | Disposition: A | Discharge status: Home / Self Care | Payer: Medicare Other | Attending: Gastroenterology | Admitting: Gastroenterology

## 2020-12-11 ENCOUNTER — Ambulatory Visit (HOSPITAL_COMMUNITY): Payer: Medicare Other

## 2020-12-11 ENCOUNTER — Other Ambulatory Visit: Payer: Self-pay

## 2020-12-11 ENCOUNTER — Ambulatory Visit (HOSPITAL_COMMUNITY): Payer: Medicare Other | Admitting: Certified Registered Nurse Anesthetist

## 2020-12-11 ENCOUNTER — Encounter (HOSPITAL_COMMUNITY): Payer: Self-pay | Admitting: Gastroenterology

## 2020-12-11 ENCOUNTER — Encounter (HOSPITAL_COMMUNITY)
Admission: RE | Disposition: A | Discharge status: Home / Self Care | Payer: Self-pay | Source: Home / Self Care | Attending: Gastroenterology

## 2020-12-11 DIAGNOSIS — R1084 Generalized abdominal pain: Secondary | ICD-10-CM | POA: Insufficient documentation

## 2020-12-11 DIAGNOSIS — K315 Obstruction of duodenum: Secondary | ICD-10-CM | POA: Diagnosis present

## 2020-12-11 DIAGNOSIS — Z79899 Other long term (current) drug therapy: Secondary | ICD-10-CM | POA: Insufficient documentation

## 2020-12-11 DIAGNOSIS — R140 Abdominal distension (gaseous): Secondary | ICD-10-CM | POA: Diagnosis not present

## 2020-12-11 DIAGNOSIS — R112 Nausea with vomiting, unspecified: Secondary | ICD-10-CM | POA: Insufficient documentation

## 2020-12-11 HISTORY — DX: Other complications of anesthesia, initial encounter: T88.59XA

## 2020-12-11 HISTORY — PX: BALLOON DILATION: SHX5330

## 2020-12-11 HISTORY — PX: ESOPHAGOGASTRODUODENOSCOPY (EGD) WITH PROPOFOL: SHX5813

## 2020-12-11 HISTORY — DX: Essential (primary) hypertension: I10

## 2020-12-11 SURGERY — ESOPHAGOGASTRODUODENOSCOPY (EGD) WITH PROPOFOL
Anesthesia: Monitor Anesthesia Care

## 2020-12-11 MED ORDER — SODIUM CHLORIDE 0.9 % IV SOLN: INTRAVENOUS | Status: DC

## 2020-12-11 MED ORDER — PROPOFOL 1000 MG/100ML IV EMUL
INTRAVENOUS | Status: AC
  Filled 2020-12-11: qty 100

## 2020-12-11 MED ORDER — PROPOFOL 500 MG/50ML IV EMUL
INTRAVENOUS | Status: DC | PRN
  Administered 2020-12-11: 150 ug/kg/min via INTRAVENOUS

## 2020-12-11 MED ORDER — LACTATED RINGERS IV SOLN: INTRAVENOUS | Status: DC | PRN

## 2020-12-11 MED ORDER — PHENYLEPHRINE 40 MCG/ML (10ML) SYRINGE FOR IV PUSH (FOR BLOOD PRESSURE SUPPORT)
PREFILLED_SYRINGE | INTRAVENOUS | Status: DC | PRN
  Administered 2020-12-11: 120 ug via INTRAVENOUS
  Administered 2020-12-11: 160 ug via INTRAVENOUS

## 2020-12-11 MED ORDER — LIDOCAINE 2% (20 MG/ML) 5 ML SYRINGE
INTRAMUSCULAR | Status: DC | PRN
  Administered 2020-12-11: 80 mg via INTRAVENOUS

## 2020-12-11 MED ORDER — PROPOFOL 10 MG/ML IV BOLUS
INTRAVENOUS | Status: DC | PRN
  Administered 2020-12-11 (×4): 20 mg via INTRAVENOUS
  Administered 2020-12-11 (×2): 50 mg via INTRAVENOUS
  Administered 2020-12-11: 20 mg via INTRAVENOUS

## 2020-12-11 SURGICAL SUPPLY — 14 items

## 2020-12-11 NOTE — Op Note (Signed)
[facility] Patient Name: Zachary Wade Procedure Date: 12/11/2020 MRN: [id number] Attending MD: Willis Modena , MD Date of Birth: Jun 20, 1956 CSN: [id number] Age: 65 Admit Type: Inpatient Procedure:                Upper GI endoscopy Indications:              Generalized abdominal pain, Stenosis of the                            duodenum, Follow-up of duodenal stenosis, For                            therapy of duodenal stenosis, Abdominal bloating,                            Nausea with vomiting Providers:                Willis Modena, MD, Charlett Lango, RN, Brion Aliment, Technician, Helayne Seminole Referring MD:             Rodrigo Ran, MD Medicines:                Monitored Anesthesia Care Complications:            No immediate complications. Estimated Blood Loss:     Estimated blood loss: none. Procedure:                Pre-Anesthesia Assessment:                           - Prior to the procedure, a History and Physical                            was performed, and patient medications and                            allergies were reviewed. The patient's tolerance of                            previous anesthesia was also reviewed. The risks                            and benefits of the procedure and the sedation                            options and risks were discussed with the patient.                            All questions were answered, and informed consent                            was obtained. Prior Anticoagulants: The patient has  taken no previous anticoagulant or antiplatelet                            agents. ASA Grade Assessment: II - A patient with                            mild systemic disease. After reviewing the risks                            and benefits, the patient was deemed in                            satisfactory condition to undergo the procedure.                           After  obtaining informed consent, the endoscope was                            passed under direct vision. Throughout the                            procedure, the patient's blood pressure, pulse, and                            oxygen saturations were monitored continuously. The                            GIF-H190 ([id number]) Olympus gastroscope was                            introduced through the mouth, and advanced to the                            second part of duodenum. The upper GI endoscopy was                            accomplished without difficulty. The patient                            tolerated the procedure well. Scope In: Scope Out: Findings:      The examined esophagus was normal.      A small amount of food (residue) was found in the gastric body and in       the gastric antrum.      Widely patent pylorus, the exam of the stomach was otherwise normal.      An acquired benign-appearing, intrinsic severe stenosis was found in the       distal duodenal bulb and was traversed after, but not before, dilation.       A TTS dilator was passed through the scope. Dilation with an 05-20-09 mm       balloon and a 07-23-11 mm balloon dilator was performed to 11 mm under       fluoroscopic guidance.      The exam of the duodenum was otherwise normal. Impression:               -  Normal esophagus.                           - A small amount of food (residue) in the stomach.                           - Acquired duodenal stenosis. Dilated. Moderate Sedation:      None Recommendation:           - Patient has a contact number available for                            emergencies. The signs and symptoms of potential                            delayed complications were discussed with the                            patient. Return to normal activities tomorrow.                            Written discharge instructions were provided to the                            patient.                            - Discharge patient to home (via wheelchair).                           - Mechanical soft diet until further notice.                           - Continue present medications.                           - Return to GI clinic in 4 weeks.                           - Return to referring physician as previously                            scheduled. Procedure Code(s):        --- Professional ---                           [phone], Esophagogastroduodenoscopy, flexible,                            transoral; with dilation of gastric/duodenal                            stricture(s) (eg, balloon, bougie) Diagnosis Code(s):        --- Professional ---                           K31.5, Obstruction of duodenum  R10.84, Generalized abdominal pain                           R14.0, Abdominal distension (gaseous)                           R11.2, Nausea with vomiting, unspecified CPT copyright 2019 American Medical Association. All rights reserved. The codes documented in this report are preliminary and upon coder review may  be revised to meet current compliance requirements. Willis Modena, MD 12/11/2020 10:17:39 AM This report has been signed electronically. Number of Addenda: 0

## 2020-12-11 NOTE — H&P (Signed)
[facility] Admission Note  Chief Complaint: duodenal stricture  HPI: Zachary Wade is an 65 y.o. male.  Nausea, vomiting, early satiety, bloating.  Known duodenal stricture.  Some improvement after recent duodenal stricture dilatation.  Past Medical History:  Diagnosis Date  . Complication of anesthesia    difficulty urinating  . GERD (gastroesophageal reflux disease)   . Hypertension     Past Surgical History:  Procedure Laterality Date  . APPENDECTOMY  1967  . BACK SURGERY     fusion c4,5,6  . BALLOON DILATION N/A 11/13/2020   Procedure: BALLOON DILATION;  Surgeon: Willis Modena, MD;  Location: [facility];  Service: Endoscopy;  Laterality: N/A;  . BIOPSY  11/13/2020   Procedure: BIOPSY;  Surgeon: Willis Modena, MD;  Location: [facility];  Service: Endoscopy;;  . ESOPHAGOGASTRODUODENOSCOPY (EGD) WITH PROPOFOL N/A 11/13/2020   Procedure: ESOPHAGOGASTRODUODENOSCOPY (EGD) WITH PROPOFOL;  Surgeon: Willis Modena, MD;  Location: [facility];  Service: Endoscopy;  Laterality: N/A;  . SPINE SURGERY  2014   C5-C7    Medications Prior to Admission  Medication Sig Dispense Refill  . amLODipine (NORVASC) 5 MG tablet Take 5 mg by mouth daily.    Marland Kitchen HYDROcodone-acetaminophen (NORCO/VICODIN) 5-325 MG per tablet Take 1 tablet by mouth 3 (three) times daily.    Marland Kitchen LORazepam (ATIVAN) 1 MG tablet Take 1 mg by mouth 2 (two) times daily.    . pantoprazole (PROTONIX) 40 MG tablet Take 40 mg by mouth daily.    . sucralfate (CARAFATE) 1 g tablet Take 1 g by mouth 3 (three) times daily.    . valsartan (DIOVAN) 40 MG tablet Take 40 mg by mouth daily.    Marland Kitchen zinc gluconate 50 MG tablet Take 50 mg by mouth daily. (Patient not taking: Reported on 12/03/2020)      Allergies: No Known Allergies  No family history on file.  Social History:  reports that he has never smoked. He has never used smokeless tobacco. He reports previous alcohol use. He reports that he does not use drugs.   ROS:  As per HPI, all others negative   Blood pressure 137/85, pulse 78, temperature 97.9 F (36.6 C), temperature source Oral, resp. rate 18, height 5\' 9"  (1.753 m), weight 72.6 kg, SpO2 98 %. General appearance: NAD HEENT:  NCAT, Anicteric ABD:  Benign NEURO:  Non-focal, A/O No results found for this or any previous visit (from the past 48 hour(s)). No results found.  Assessment/Plan  1.  Nausea and vomiting. 2.  Duodenal stricture; EGD with stricture dilatation one month ago done with improvement, but not resolution, of symptoms (bloating, nausea, vomiting, early satiety). 3.  Endoscopy with anticipated duodenal stricture dilatation (with use of fluoroscopy). 4.  Risks (bleeding, infection, bowel perforation that could require surgery, sedation-related changes in cardiopulmonary systems), benefits (identification and possible treatment of source of symptoms, exclusion of certain causes of symptoms), and alternatives (watchful waiting, radiographic imaging studies, empiric medical treatment) of upper endoscopy with possible duodenal stricture dilatation (EGD +/- DIL) were explained to patient/family in detail and patient wishes to proceed.  12/11/2020, 9:14 AM

## 2020-12-11 NOTE — Anesthesia Postprocedure Evaluation (Signed)
Anesthesia Post Note  Patient: JERAMI TAMMEN  Procedure(s) Performed: ESOPHAGOGASTRODUODENOSCOPY (EGD) WITH PROPOFOL (N/A ) BALLOON DILATION (N/A )     Patient location during evaluation: Endoscopy Anesthesia Type: MAC Level of consciousness: awake and alert, patient cooperative and oriented Pain management: pain level controlled Vital Signs Assessment: post-procedure vital signs reviewed and stable Respiratory status: spontaneous breathing, respiratory function stable and nonlabored ventilation Cardiovascular status: blood pressure returned to baseline and stable Postop Assessment: no apparent nausea or vomiting and able to ambulate Anesthetic complications: no   No complications documented.  Last Vitals:  Vitals:   12/11/20 1030 12/11/20 1040  BP: 122/78 (!) 141/82  Pulse: 70 72  Resp: 16 15  Temp:    SpO2: 100% 99%    Last Pain:  Vitals:   12/11/20 1040  TempSrc:   PainSc: 0-No pain                 JACKSON,E. CARSWELL

## 2020-12-11 NOTE — Anesthesia Preprocedure Evaluation (Addendum)
Anesthesia Evaluation  Patient identified by MRN, date of birth, ID band Patient awake    Reviewed: Allergy & Precautions, NPO status , Patient's Chart, lab work & pertinent test results  History of Anesthesia Complications Negative for: history of anesthetic complications  Airway Mallampati: II  TM Distance: >3 FB Neck ROM: Full    Dental  (+) Caps, Dental Advisory Given   Pulmonary neg pulmonary ROS,  12/09/2020 SARS coronavirus NEG   breath sounds clear to auscultation       Cardiovascular hypertension, Pt. on medications (-) angina Rhythm:Regular Rate:Normal     Neuro/Psych Chronic neck and back pain: narcotics negative psych ROS   GI/Hepatic GERD  Medicated and Controlled,(+)     substance abuse  ,   Endo/Other    Renal/GU negative Renal ROS     Musculoskeletal  (+) narcotic dependent  Abdominal   Peds  Hematology negative hematology ROS (+)   Anesthesia Other Findings   Reproductive/Obstetrics                            Anesthesia Physical Anesthesia Plan  ASA: II  Anesthesia Plan: MAC   Post-op Pain Management:    Induction:   PONV Risk Score and Plan: 1 and Treatment may vary due to age or medical condition  Airway Management Planned: Natural Airway and Nasal Cannula  Additional Equipment: None  Intra-op Plan:   Post-operative Plan:   Informed Consent: I have reviewed the patients History and Physical, chart, labs and discussed the procedure including the risks, benefits and alternatives for the proposed anesthesia with the patient or authorized representative who has indicated his/her understanding and acceptance.     Dental advisory given  Plan Discussed with: CRNA and Surgeon  Anesthesia Plan Comments:        Anesthesia Quick Evaluation

## 2020-12-11 NOTE — Discharge Instructions (Signed)
YOU HAD AN ENDOSCOPIC PROCEDURE TODAY: Refer to the procedure report and other information in the discharge instructions given to you for any specific questions about what was found during the examination. If this information does not answer your questions, please call [place] office at [phone] to clarify.   YOU SHOULD EXPECT: Some feelings of bloating in the abdomen. Passage of more gas than usual. Walking can help get rid of the air that was put into your GI tract during the procedure and reduce the bloating. If you had a lower endoscopy (such as a colonoscopy or flexible sigmoidoscopy) you may notice spotting of blood in your stool or on the toilet paper. Some abdominal soreness may be present for a day or two, also.  DIET: Your first meal following the procedure should be a light meal and then it is ok to progress to your soft mechanical diet.  A half-sandwich or bowl of soup is an example of a good first meal. Heavy or fried foods are harder to digest and may make you feel nauseous or bloated. Drink plenty of fluids but you should avoid alcoholic beverages for 24 hours. If you had a esophageal dilation, please see attached instructions for diet.    ACTIVITY: Your care partner should take you home directly after the procedure. You should plan to take it easy, moving slowly for the rest of the day. You can resume normal activity the day after the procedure however YOU SHOULD NOT DRIVE, use power tools, machinery or perform tasks that involve climbing or major physical exertion for 24 hours (because of the sedation medicines used during the test).   SYMPTOMS TO REPORT IMMEDIATELY: A gastroenterologist can be reached at any hour. Please call [phone]  for any of the following symptoms:  . Following upper endoscopy (EGD, EUS, ERCP, esophageal dilation) Vomiting of blood or coffee ground material  New, significant abdominal pain  New, significant chest pain or pain under the shoulder blades   Painful or persistently difficult swallowing  New shortness of breath  Black, tarry-looking or red, bloody stools  FOLLOW UP:  If any biopsies were taken you will be contacted by phone or by letter within the next 1-3 weeks. Call [phone]  if you have not heard about the biopsies in 3 weeks.  Please also call with any specific questions about appointments or follow up tests.

## 2020-12-11 NOTE — Transfer of Care (Signed)
Immediate Anesthesia Transfer of Care Note  Patient: Zachary Wade  Procedure(s) Performed: ESOPHAGOGASTRODUODENOSCOPY (EGD) WITH PROPOFOL (N/A ) BALLOON DILATION (N/A )  Patient Location: PACU and Endoscopy Unit  Anesthesia Type:MAC  Level of Consciousness: awake, alert  and oriented  Airway & Oxygen Therapy: Patient Spontanous Breathing  Post-op Assessment: Report given to RN and Post -op Vital signs reviewed and stable  Post vital signs: Reviewed and stable  Last Vitals:  Vitals Value Taken Time  BP 105/55 12/11/20 1010  Temp    Pulse 66 12/11/20 1011  Resp 30 12/11/20 1011  SpO2 100 % 12/11/20 1011  Vitals shown include unvalidated device data.  Last Pain:  Vitals:   12/11/20 0841  TempSrc: Oral         Complications: No complications documented.

## 2020-12-12 ENCOUNTER — Encounter (HOSPITAL_COMMUNITY): Payer: Self-pay | Admitting: Gastroenterology

## 2021-01-23 ENCOUNTER — Other Ambulatory Visit: Payer: Self-pay | Admitting: Gastroenterology

## 2021-01-23 DIAGNOSIS — K222 Esophageal obstruction: Secondary | ICD-10-CM

## 2021-02-10 ENCOUNTER — Other Ambulatory Visit: Payer: Self-pay

## 2021-02-10 ENCOUNTER — Other Ambulatory Visit (HOSPITAL_COMMUNITY)
Admission: RE | Admit: 2021-02-10 | Discharge: 2021-02-10 | Disposition: A | Discharge status: Home / Self Care | Payer: Medicare Other | Source: Ambulatory Visit | Attending: Gastroenterology | Admitting: Gastroenterology

## 2021-02-10 DIAGNOSIS — Z01812 Encounter for preprocedural laboratory examination: Secondary | ICD-10-CM | POA: Insufficient documentation

## 2021-02-10 DIAGNOSIS — Z20822 Contact with and (suspected) exposure to covid-19: Secondary | ICD-10-CM | POA: Insufficient documentation

## 2021-02-11 LAB — SARS CORONAVIRUS 2 (TAT 6-24 HRS): SARS Coronavirus 2: NEGATIVE

## 2021-02-12 ENCOUNTER — Ambulatory Visit (HOSPITAL_COMMUNITY): Payer: Medicare Other | Admitting: Anesthesiology

## 2021-02-12 ENCOUNTER — Encounter (HOSPITAL_COMMUNITY): Payer: Self-pay | Admitting: Gastroenterology

## 2021-02-12 ENCOUNTER — Encounter (HOSPITAL_COMMUNITY)
Admission: RE | Disposition: A | Discharge status: Home / Self Care | Payer: Self-pay | Source: Home / Self Care | Attending: Gastroenterology

## 2021-02-12 ENCOUNTER — Ambulatory Visit (HOSPITAL_COMMUNITY): Payer: Medicare Other

## 2021-02-12 ENCOUNTER — Ambulatory Visit (HOSPITAL_COMMUNITY)
Admission: RE | Admit: 2021-02-12 | Discharge: 2021-02-12 | Disposition: A | Discharge status: Home / Self Care | Payer: Medicare Other | Attending: Gastroenterology | Admitting: Gastroenterology

## 2021-02-12 DIAGNOSIS — Z79899 Other long term (current) drug therapy: Secondary | ICD-10-CM | POA: Insufficient documentation

## 2021-02-12 DIAGNOSIS — Z888 Allergy status to other drugs, medicaments and biological substances status: Secondary | ICD-10-CM | POA: Insufficient documentation

## 2021-02-12 DIAGNOSIS — K219 Gastro-esophageal reflux disease without esophagitis: Secondary | ICD-10-CM | POA: Insufficient documentation

## 2021-02-12 DIAGNOSIS — K315 Obstruction of duodenum: Secondary | ICD-10-CM | POA: Diagnosis not present

## 2021-02-12 DIAGNOSIS — K222 Esophageal obstruction: Secondary | ICD-10-CM

## 2021-02-12 HISTORY — PX: BALLOON DILATION: SHX5330

## 2021-02-12 HISTORY — PX: ESOPHAGOGASTRODUODENOSCOPY (EGD) WITH PROPOFOL: SHX5813

## 2021-02-12 SURGERY — ESOPHAGOGASTRODUODENOSCOPY (EGD) WITH PROPOFOL
Anesthesia: Monitor Anesthesia Care

## 2021-02-12 MED ORDER — SODIUM CHLORIDE 0.9 % IV SOLN: INTRAVENOUS | Status: DC

## 2021-02-12 MED ORDER — PROPOFOL 500 MG/50ML IV EMUL
INTRAVENOUS | Status: AC
  Filled 2021-02-12: qty 50

## 2021-02-12 MED ORDER — PROPOFOL 10 MG/ML IV BOLUS
INTRAVENOUS | Status: AC
  Filled 2021-02-12: qty 20

## 2021-02-12 MED ORDER — PROPOFOL 10 MG/ML IV BOLUS
INTRAVENOUS | Status: DC | PRN
  Administered 2021-02-12: 30 mg via INTRAVENOUS

## 2021-02-12 MED ORDER — LIDOCAINE 2% (20 MG/ML) 5 ML SYRINGE
INTRAMUSCULAR | Status: DC | PRN
  Administered 2021-02-12: 80 mg via INTRAVENOUS

## 2021-02-12 MED ORDER — PROPOFOL 500 MG/50ML IV EMUL
INTRAVENOUS | Status: DC | PRN
  Administered 2021-02-12: 150 ug/kg/min via INTRAVENOUS

## 2021-02-12 MED ORDER — LACTATED RINGERS IV SOLN: INTRAVENOUS | Status: DC

## 2021-02-12 SURGICAL SUPPLY — 14 items

## 2021-02-12 NOTE — Anesthesia Preprocedure Evaluation (Addendum)
Anesthesia Evaluation  Patient identified by MRN, date of birth, ID band Patient awake    Reviewed: Allergy & Precautions, H&P , NPO status , Patient's Chart, lab work & pertinent test results  Airway Mallampati: III  TM Distance: >3 FB Neck ROM: Full    Dental no notable dental hx. (+) Teeth Intact, Dental Advisory Given   Pulmonary neg pulmonary ROS,    Pulmonary exam normal breath sounds clear to auscultation       Cardiovascular hypertension, Pt. on medications  Rhythm:Regular Rate:Normal     Neuro/Psych negative neurological ROS  negative psych ROS   GI/Hepatic Neg liver ROS, GERD  Medicated,  Endo/Other  negative endocrine ROS  Renal/GU negative Renal ROS  negative genitourinary   Musculoskeletal   Abdominal   Peds  Hematology negative hematology ROS (+)   Anesthesia Other Findings   Reproductive/Obstetrics negative OB ROS                            Anesthesia Physical Anesthesia Plan  ASA: II  Anesthesia Plan: MAC   Post-op Pain Management:    Induction: Intravenous  PONV Risk Score and Plan: 1 and Propofol infusion  Airway Management Planned: Mask  Additional Equipment:   Intra-op Plan:   Post-operative Plan:   Informed Consent: I have reviewed the patients History and Physical, chart, labs and discussed the procedure including the risks, benefits and alternatives for the proposed anesthesia with the patient or authorized representative who has indicated his/her understanding and acceptance.     Dental advisory given  Plan Discussed with: CRNA  Anesthesia Plan Comments:         Anesthesia Quick Evaluation

## 2021-02-12 NOTE — Transfer of Care (Signed)
Immediate Anesthesia Transfer of Care Note  Patient: Zachary Wade  Procedure(s) Performed: ESOPHAGOGASTRODUODENOSCOPY (EGD) WITH PROPOFOL (N/A ) BALLOON DILATION (N/A )  Patient Location: Endoscopy Unit  Anesthesia Type:MAC  Level of Consciousness: drowsy and patient cooperative  Airway & Oxygen Therapy: Patient Spontanous Breathing and Patient connected to face mask oxygen  Post-op Assessment: Report given to RN and Post -op Vital signs reviewed and stable  Post vital signs: Reviewed and stable  Last Vitals:  Vitals Value Taken Time  BP 125/94 02/12/21 1140  Temp    Pulse 70 02/12/21 1141  Resp 17 02/12/21 1141  SpO2 100 % 02/12/21 1141  Vitals shown include unvalidated device data.  Last Pain:  Vitals:   02/12/21 1140  TempSrc:   PainSc: 0-No pain         Complications: No complications documented.

## 2021-02-12 NOTE — H&P (Signed)
[facility] Admission Note  Chief Complaint: duodenal stricture   HPI: Zachary Wade is an 65 y.o. male.  Improving, but not resolved, symptoms after duodenal stricture dilatation.  Maintaining weight.  Able to eat more.  Past Medical History:  Diagnosis Date  . Complication of anesthesia    difficulty urinating  . GERD (gastroesophageal reflux disease)   . Hypertension     Past Surgical History:  Procedure Laterality Date  . APPENDECTOMY  1967  . BACK SURGERY     fusion c4,5,6  . BALLOON DILATION N/A 11/13/2020   Procedure: BALLOON DILATION;  Surgeon: Willis Modena, MD;  Location: [facility];  Service: Endoscopy;  Laterality: N/A;  . BALLOON DILATION N/A 12/11/2020   Procedure: BALLOON DILATION;  Surgeon: Willis Modena, MD;  Location: [facility];  Service: Endoscopy;  Laterality: N/A;  . BIOPSY  11/13/2020   Procedure: BIOPSY;  Surgeon: Willis Modena, MD;  Location: [facility];  Service: Endoscopy;;  . ESOPHAGOGASTRODUODENOSCOPY (EGD) WITH PROPOFOL N/A 11/13/2020   Procedure: ESOPHAGOGASTRODUODENOSCOPY (EGD) WITH PROPOFOL;  Surgeon: Willis Modena, MD;  Location: [facility];  Service: Endoscopy;  Laterality: N/A;  . ESOPHAGOGASTRODUODENOSCOPY (EGD) WITH PROPOFOL N/A 12/11/2020   Procedure: ESOPHAGOGASTRODUODENOSCOPY (EGD) WITH PROPOFOL;  Surgeon: Willis Modena, MD;  Location: [facility];  Service: Endoscopy;  Laterality: N/A;  . SPINE SURGERY  2014   C5-C7    Medications Prior to Admission  Medication Sig Dispense Refill  . amLODipine (NORVASC) 5 MG tablet Take 5 mg by mouth daily.    Marland Kitchen b complex vitamins capsule Take 2 capsules by mouth 3 (three) times daily.    Marland Kitchen HYDROcodone-acetaminophen (NORCO/VICODIN) 5-325 MG per tablet Take 1 tablet by mouth 3 (three) times daily.    Marland Kitchen LORazepam (ATIVAN) 1 MG tablet Take 1 mg by mouth 2 (two) times daily.    . pantoprazole (PROTONIX) 40 MG tablet Take 40 mg by mouth See admin instructions. Take 40 mg twice daily,  may take a 3rd 40 mg dose as needed for indigestion    . ramipril (ALTACE) 5 MG capsule Take 5 mg by mouth daily.    . sucralfate (CARAFATE) 1 g tablet Take 1 g by mouth 3 (three) times daily.      Allergies:  Allergies  Allergen Reactions  . Valsartan     Headaches     History reviewed. No pertinent family history.  Social History:  reports that he has never smoked. He has never used smokeless tobacco. He reports previous alcohol use. He reports that he does not use drugs.   ROS: As per HPI, all others negative  Height 5\' 9"  (1.753 m), weight 72.6 kg. General appearance: GEN, NAD HEENT:  [place]/AT, anicteric NEURO:  A/O x 4; non-focal CV:  Regular ABD:  Soft, non-tender  No results found for this or any previous visit (from the past 48 hour(s)). No results found.  Assessment/Plan  1.  Duodenal stricture.  Symptoms of obstruction much improved, but not resolved, with couple prior dilatations. 2.  Plan for endoscopy for duodenal stricture dilatation. 3.  Risks (bleeding, infection, bowel perforation that could require surgery, sedation-related changes in cardiopulmonary systems), benefits (identification and possible treatment of source of symptoms, exclusion of certain causes of symptoms), and alternatives (watchful waiting, radiographic imaging studies, empiric medical treatment) of upper endoscopy with possible duodenal stricture dilatation (EGD +/- DIL) were explained to patient/family in detail and patient wishes to proceed.  02/12/2021, 10:40 AM

## 2021-02-12 NOTE — Discharge Instructions (Signed)
YOU HAD AN ENDOSCOPIC PROCEDURE TODAY: Refer to the procedure report and other information in the discharge instructions given to you for any specific questions about what was found during the examination. If this information does not answer your questions, please call [facility] office at [phone] to clarify.   YOU SHOULD EXPECT: Some feelings of bloating in the abdomen. Passage of more gas than usual. Walking can help get rid of the air that was put into your GI tract during the procedure and reduce the bloating.   DIET: Your first meal following the procedure should be a light meal and then it is ok to progress to your normal diet. A half-sandwich or bowl of soup is an example of a good first meal. Heavy or fried foods are harder to digest and may make you feel nauseous or bloated. Drink plenty of fluids but you should avoid alcoholic beverages for 24 hours. If you had a esophageal dilation, please see attached instructions for diet.    ACTIVITY: Your care partner should take you home directly after the procedure. You should plan to take it easy, moving slowly for the rest of the day. You can resume normal activity the day after the procedure however YOU SHOULD NOT DRIVE, use power tools, machinery or perform tasks that involve climbing or major physical exertion for 24 hours (because of the sedation medicines used during the test).   SYMPTOMS TO REPORT IMMEDIATELY: A gastroenterologist can be reached at any hour. Please call [phone]  for any of the following symptoms:   Following upper endoscopy (EGD, EUS, ERCP, esophageal dilation) Vomiting of blood or coffee ground material  New, significant abdominal pain  New, significant chest pain or pain under the shoulder blades  Painful or persistently difficult swallowing  New shortness of breath  Black, tarry-looking or red, bloody stools  FOLLOW UP:  If any biopsies were taken you will be contacted by phone or by letter within the next 1-3  weeks. Call [phone]  if you have not heard about the biopsies in 3 weeks.  Please also call with any specific questions about appointments or follow up tests.  

## 2021-02-12 NOTE — Anesthesia Postprocedure Evaluation (Signed)
Anesthesia Post Note  Patient: BURNS TIMSON  Procedure(s) Performed: ESOPHAGOGASTRODUODENOSCOPY (EGD) WITH PROPOFOL (N/A ) BALLOON DILATION (N/A )     Patient location during evaluation: Endoscopy Anesthesia Type: MAC Level of consciousness: awake and alert Pain management: pain level controlled Vital Signs Assessment: post-procedure vital signs reviewed and stable Respiratory status: spontaneous breathing, nonlabored ventilation and respiratory function stable Cardiovascular status: stable and blood pressure returned to baseline Postop Assessment: no apparent nausea or vomiting Anesthetic complications: no   No complications documented.  Last Vitals:  Vitals:   02/12/21 1150 02/12/21 1200  BP: 137/86 138/85  Pulse: 64   Resp: 13   Temp:    SpO2: 100% 96%    Last Pain:  Vitals:   02/12/21 1200  TempSrc:   PainSc: 0-No pain                 Jhonnie Aliano,W. EDMOND

## 2021-02-12 NOTE — Op Note (Signed)
[facility] Patient Name: Zachary Wade Procedure Date: 02/12/2021 MRN: [id number] Attending MD: Willis Modena , MD Date of Birth: [birth year] CSN: [id number] Age: 65 Admit Type: Outpatient Procedure:                Upper GI endoscopy Indications:              Follow-up of duodenal stenosis, For therapy of                            duodenal stenosis Providers:                Willis Modena, MD, Clearnce Sorrel, RN, Michele Mcalpine                            Technician, Clare Gandy, CRNA Referring MD:              Medicines:                Monitored Anesthesia Care Complications:            No immediate complications. Estimated Blood Loss:     Estimated blood loss: none. Estimated blood loss:                            none. Estimated blood loss: none. Procedure:                Pre-Anesthesia Assessment:                           - Prior to the procedure, a History and Physical                            was performed, and patient medications and                            allergies were reviewed. The patient's tolerance of                            previous anesthesia was also reviewed. The risks                            and benefits of the procedure and the sedation                            options and risks were discussed with the patient.                            All questions were answered, and informed consent                            was obtained. Prior Anticoagulants: The patient has                            taken no previous anticoagulant or antiplatelet  agents. ASA Grade Assessment: II - A patient with                            mild systemic disease. After reviewing the risks                            and benefits, the patient was deemed in                            satisfactory condition to undergo the procedure.                           After obtaining informed consent, the endoscope was                             passed under direct vision. Throughout the                            procedure, the patient's blood pressure, pulse, and                            oxygen saturations were monitored continuously. The                            GIF-H190 ([id number]) was introduced through the                            mouth, and advanced to the second part of duodenum.                            The upper GI endoscopy was accomplished without                            difficulty. The patient tolerated the procedure                            well. Scope In: Scope Out: Findings:      The examined esophagus was normal.      The entire examined stomach was normal.      An acquired benign-appearing, intrinsic moderate stenosis was found in       the duodenal bulb and was traversed after dilation. The dilation site       was examined and showed mild mucosal disruption and moderate improvement       in luminal narrowing. A TTS dilator was passed through the scope.       Dilation with an 05-20-09 mm and a 07-23-11 mm pyloric balloon dilator was       performed under fluoroscopic guidance. Maximum dilatation to 12 mm. The       dilation site was examined and showed mild mucosal disruption and       moderate improvement in luminal narrowing. Remainder of D2/D3 normal. Impression:               - Normal esophagus.                           -  Normal stomach.                           - Acquired duodenal stenosis. 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 The signs and symptoms of potential                            delayed complications were discussed with the                            patient. Return to normal activities tomorrow.                            Written discharge instructions were provided to the                            patient.                           - Discharge patient to home  (via wheelchair).                           - Soft diet today.                           - Continue present medications.                           - Return to GI clinic in 3 months.                           - Return to referring physician as previously                            scheduled. Procedure Code(s):        --- Professional ---                           [phone], Esophagogastroduodenoscopy, flexible,                            transoral; with dilation of gastric/duodenal                            stricture(s) (eg, balloon, bougie) Diagnosis Code(s):        --- Professional ---                           K31.5, Obstruction of duodenum CPT copyright 2019 [employer]. All rights reserved. The codes documented in this report are preliminary and upon coder review may  be revised to meet current compliance requirements. Willis Modena, MD 02/12/2021 11:42:29 AM This report has been  signed electronically. Number of Addenda: 0

## 2021-02-13 ENCOUNTER — Encounter (HOSPITAL_COMMUNITY): Payer: Self-pay | Admitting: Gastroenterology

## 2021-09-11 ENCOUNTER — Other Ambulatory Visit: Payer: Self-pay | Admitting: Gastroenterology

## 2021-09-12 ENCOUNTER — Other Ambulatory Visit: Payer: Self-pay | Admitting: Gastroenterology

## 2021-11-04 ENCOUNTER — Encounter (HOSPITAL_COMMUNITY): Payer: Self-pay | Admitting: Gastroenterology

## 2021-11-11 ENCOUNTER — Other Ambulatory Visit: Payer: Self-pay | Admitting: Gastroenterology

## 2021-11-11 NOTE — Anesthesia Preprocedure Evaluation (Addendum)
Anesthesia Evaluation  Patient identified by MRN, date of birth, ID band Patient awake    Reviewed: Allergy & Precautions, NPO status , Patient's Chart, lab work & pertinent test results  History of Anesthesia Complications (+) history of anesthetic complications (Urinary retention)  Airway Mallampati: II  TM Distance: >3 FB Neck ROM: Full    Dental no notable dental hx. (+) Teeth Intact, Dental Advisory Given   Pulmonary neg pulmonary ROS,    Pulmonary exam normal breath sounds clear to auscultation       Cardiovascular hypertension, Pt. on medications Normal cardiovascular exam Rhythm:Regular Rate:Normal     Neuro/Psych    GI/Hepatic GERD  ,  Endo/Other  negative endocrine ROS  Renal/GU      Musculoskeletal   Abdominal   Peds  Hematology   Anesthesia Other Findings ALL: valsartan  Reproductive/Obstetrics                            Anesthesia Physical Anesthesia Plan  ASA: 2  Anesthesia Plan: MAC   Post-op Pain Management:    Induction:   PONV Risk Score and Plan: 1 and Treatment may vary due to age or medical condition  Airway Management Planned: Natural Airway and Nasal Cannula  Additional Equipment: None  Intra-op Plan:   Post-operative Plan:   Informed Consent: I have reviewed the patients History and Physical, chart, labs and discussed the procedure including the risks, benefits and alternatives for the proposed anesthesia with the patient or authorized representative who has indicated his/her understanding and acceptance.     Dental advisory given  Plan Discussed with: CRNA and Anesthesiologist  Anesthesia Plan Comments: (Duodenal stricture for EGD and balloon dilatation)       Anesthesia Quick Evaluation

## 2021-11-12 ENCOUNTER — Encounter (HOSPITAL_COMMUNITY)
Admission: RE | Disposition: A | Discharge status: Home / Self Care | Payer: Self-pay | Source: Ambulatory Visit | Attending: Gastroenterology

## 2021-11-12 ENCOUNTER — Ambulatory Visit (HOSPITAL_COMMUNITY)
Admission: RE | Admit: 2021-11-12 | Discharge: 2021-11-12 | Disposition: A | Discharge status: Home / Self Care | Payer: Medicare Other | Source: Ambulatory Visit | Attending: Gastroenterology | Admitting: Gastroenterology

## 2021-11-12 ENCOUNTER — Ambulatory Visit (HOSPITAL_COMMUNITY): Payer: Medicare Other | Admitting: Anesthesiology

## 2021-11-12 ENCOUNTER — Ambulatory Visit (HOSPITAL_COMMUNITY): Payer: Medicare Other

## 2021-11-12 ENCOUNTER — Other Ambulatory Visit: Payer: Self-pay

## 2021-11-12 DIAGNOSIS — I10 Essential (primary) hypertension: Secondary | ICD-10-CM | POA: Diagnosis not present

## 2021-11-12 DIAGNOSIS — K219 Gastro-esophageal reflux disease without esophagitis: Secondary | ICD-10-CM | POA: Insufficient documentation

## 2021-11-12 DIAGNOSIS — K2289 Other specified disease of esophagus: Secondary | ICD-10-CM | POA: Diagnosis not present

## 2021-11-12 DIAGNOSIS — K222 Esophageal obstruction: Secondary | ICD-10-CM | POA: Diagnosis not present

## 2021-11-12 DIAGNOSIS — K315 Obstruction of duodenum: Secondary | ICD-10-CM | POA: Diagnosis present

## 2021-11-12 DIAGNOSIS — Z419 Encounter for procedure for purposes other than remedying health state, unspecified: Secondary | ICD-10-CM

## 2021-11-12 HISTORY — PX: ESOPHAGEAL DILATION: SHX303

## 2021-11-12 HISTORY — PX: ESOPHAGOGASTRODUODENOSCOPY (EGD) WITH PROPOFOL: SHX5813

## 2021-11-12 HISTORY — PX: BIOPSY: SHX5522

## 2021-11-12 SURGERY — ESOPHAGOGASTRODUODENOSCOPY (EGD) WITH PROPOFOL
Anesthesia: Monitor Anesthesia Care

## 2021-11-12 MED ORDER — LIDOCAINE 2% (20 MG/ML) 5 ML SYRINGE
INTRAMUSCULAR | Status: DC | PRN
  Administered 2021-11-12: 40 mg via INTRAVENOUS

## 2021-11-12 MED ORDER — LACTATED RINGERS IV SOLN: INTRAVENOUS | Status: DC

## 2021-11-12 MED ORDER — PROPOFOL 10 MG/ML IV BOLUS
INTRAVENOUS | Status: DC | PRN
  Administered 2021-11-12: 30 mg via INTRAVENOUS
  Administered 2021-11-12: 80 mg via INTRAVENOUS

## 2021-11-12 MED ORDER — PROPOFOL 500 MG/50ML IV EMUL
INTRAVENOUS | Status: DC | PRN
  Administered 2021-11-12: 150 ug/kg/min via INTRAVENOUS

## 2021-11-12 MED ORDER — SODIUM CHLORIDE 0.9 % IV SOLN: INTRAVENOUS | Status: DC

## 2021-11-12 SURGICAL SUPPLY — 15 items

## 2021-11-12 NOTE — H&P (Signed)
[facility] H/P Note  Chief Complaint: duodenal stricture  HPI: Zachary Wade is an 66 y.o. male.  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  Prior biopsies and imaging negative for malignancy.  Last endoscopic dilatation May 2022.  Past Medical History:  Diagnosis Date   Complication of anesthesia    difficulty urinating   GERD (gastroesophageal reflux disease)    Hypertension     Past Surgical History:  Procedure Laterality Date   APPENDECTOMY  1967   BACK SURGERY     fusion c4,5,6   BALLOON DILATION N/A 11/13/2020   Procedure: BALLOON DILATION;  Surgeon: Willis Modena, MD;  Location: [facility];  Service: Endoscopy;  Laterality: N/A;   BALLOON DILATION N/A 12/11/2020   Procedure: BALLOON DILATION;  Surgeon: Willis Modena, MD;  Location: [facility];  Service: Endoscopy;  Laterality: N/A;   BALLOON DILATION N/A 02/12/2021   Procedure: BALLOON DILATION;  Surgeon: Willis Modena, MD;  Location: [facility];  Service: Endoscopy;  Laterality: N/A;   BIOPSY  11/13/2020   Procedure: BIOPSY;  Surgeon: Willis Modena, MD;  Location: [facility];  Service: Endoscopy;;   ESOPHAGOGASTRODUODENOSCOPY (EGD) WITH PROPOFOL N/A 11/13/2020   Procedure: ESOPHAGOGASTRODUODENOSCOPY (EGD) WITH PROPOFOL;  Surgeon: Willis Modena, MD;  Location: [facility];  Service: Endoscopy;  Laterality: N/A;   ESOPHAGOGASTRODUODENOSCOPY (EGD) WITH PROPOFOL N/A 12/11/2020   Procedure: ESOPHAGOGASTRODUODENOSCOPY (EGD) WITH PROPOFOL;  Surgeon: Willis Modena, MD;  Location: [facility];  Service: Endoscopy;  Laterality: N/A;   ESOPHAGOGASTRODUODENOSCOPY (EGD) WITH PROPOFOL N/A 02/12/2021   Procedure: ESOPHAGOGASTRODUODENOSCOPY (EGD) WITH PROPOFOL;  Surgeon: Willis Modena, MD;  Location: [facility];  Service: Endoscopy;  Laterality: N/A;   SPINE SURGERY  2014   C5-C7    Medications Prior to Admission  Medication Sig Dispense Refill   amLODipine (NORVASC) 5 MG tablet Take  5 mg by mouth daily.     HYDROcodone-acetaminophen (NORCO/VICODIN) 5-325 MG per tablet Take 1 tablet by mouth 3 (three) times daily.     LORazepam (ATIVAN) 1 MG tablet Take 1 mg by mouth 2 (two) times daily as needed for sleep or anxiety.     Multiple Vitamins-Minerals (MULTIVITAMIN WITH MINERALS) tablet Take 1 tablet by mouth daily.     pantoprazole (PROTONIX) 40 MG tablet Take 40 mg by mouth See admin instructions. Take 40 mg twice daily, may take a 3rd 40 mg dose as needed for indigestion     ramipril (ALTACE) 5 MG capsule Take 5 mg by mouth in the morning.     sucralfate (CARAFATE) 1 g tablet Take 1 g by mouth daily as needed (Stomach issues).      Allergies:  Allergies  Allergen Reactions   Valsartan     Headaches     History reviewed. No pertinent family history.  Social History:  reports that he has never smoked. He has never used smokeless tobacco. He reports that he does not currently use alcohol. He reports that he does not use drugs.   ROS: As per HPI, all others negative   Blood pressure (!) 151/93, pulse 77, temperature 98.7 F (37.1 C), temperature source Oral, resp. rate (!) 9, height 5\' 10"  (1.778 m), weight 72.6 kg, SpO2 98 %. General appearance: NAD HEENT:  [place]/AT, anicteric NECK:  Supple CV:  Regular RESP:  Clear ABD:  Soft, non-tender NEURO:  A/O, non-focal  No results found for this or any previous visit (from the past 48 hour(s)). No results found.  Assessment/Plan   Duodenal stricture.  Plan for  EGD with possible duodenal stricture dilatation with fluoroscopy available. Risks (bleeding, infection, bowel perforation that could require surgery, sedation-related changes in cardiopulmonary systems), benefits (identification and possible treatment of source of symptoms, exclusion of certain causes of symptoms), and alternatives (watchful waiting, radiographic imaging studies, empiric medical treatment) of upper endoscopy (EGD) were explained to patient/family  in detail and patient wishes to proceed.   [name] 11/12/2021, 9:29 AM

## 2021-11-12 NOTE — Discharge Instructions (Signed)
Endoscopy Care After Please read the instructions outlined below and refer to this sheet in the next few weeks. These discharge instructions provide you with general information on caring for yourself after you leave the hospital. Your doctor may also give you specific instructions. While your treatment has been planned according to the most current medical practices available, unavoidable complications occasionally occur. If you have any problems or questions after discharge, please call Dr. Paulita Fujita [facility]) at [phone].  HOME CARE INSTRUCTIONS Activity You may resume your regular activity but move at a slower pace for the next 24 hours.  Take frequent rest periods for the next 24 hours.  Walking will help expel (get rid of) the air and reduce the bloated feeling in your abdomen.  No driving for 24 hours (because of the anesthesia (medicine) used during the test).  You may shower.  Do not sign any important legal documents or operate any machinery for 24 hours (because of the anesthesia used during the test).  Nutrition Drink plenty of fluids.  Soft diet today, then back to routine (your normal low residue diet) diet tomorrow Avoid alcoholic beverages for 24 hours or as instructed by your caregiver.  Medications You may resume your normal medications unless your caregiver tells you otherwise. What you can expect today You may experience abdominal discomfort such as a feeling of fullness or "gas" pains.  You may experience a sore throat for 2 to 3 days. This is normal. Gargling with salt water may help this.   SEEK IMMEDIATE MEDICAL CARE IF: You have excessive nausea (feeling sick to your stomach) and/or vomiting.  You have severe abdominal pain and distention (swelling).  You have trouble swallowing.  You have a temperature over 100 F (37.8 C).  You have rectal bleeding or vomiting of blood.  Document Released: 05/12/2004 Document Revised: 06/10/2011 Document  Reviewed: 11/23/2007 [facility] Patient Information 2012 [name].

## 2021-11-12 NOTE — Anesthesia Postprocedure Evaluation (Signed)
Anesthesia Post Note  Patient: Zachary Wade  Procedure(s) Performed: ESOPHAGOGASTRODUODENOSCOPY (EGD) WITH PROPOFOL ESOPHAGEAL DILATION BIOPSY     Patient location during evaluation: Endoscopy Anesthesia Type: MAC Level of consciousness: awake and alert Pain management: pain level controlled Vital Signs Assessment: post-procedure vital signs reviewed and stable Respiratory status: spontaneous breathing, nonlabored ventilation, respiratory function stable and patient connected to nasal cannula oxygen Cardiovascular status: blood pressure returned to baseline and stable Postop Assessment: no apparent nausea or vomiting Anesthetic complications: no   No notable events documented.  Last Vitals:  Vitals:   11/12/21 1045 11/12/21 1050  BP: 119/78 120/76  Pulse: 75 75  Resp: 16 17  Temp:    SpO2: 95% 95%    Last Pain:  Vitals:   11/12/21 1021  TempSrc: Oral                 Barnet Glasgow

## 2021-11-12 NOTE — Transfer of Care (Signed)
Immediate Anesthesia Transfer of Care Note  Patient: Zachary Wade  Procedure(s) Performed: ESOPHAGOGASTRODUODENOSCOPY (EGD) WITH PROPOFOL ESOPHAGEAL DILATION BIOPSY  Patient Location: PACU  Anesthesia Type:MAC  Level of Consciousness: awake, alert , oriented and patient cooperative  Airway & Oxygen Therapy: Patient Spontanous Breathing and Patient connected to face mask oxygen  Post-op Assessment: Report given to RN and Post -op Vital signs reviewed and stable  Post vital signs: Reviewed and stable  Last Vitals:  Vitals Value Taken Time  BP    Temp    Pulse 80 11/12/21 1021  Resp 16 11/12/21 1021  SpO2 97 % 11/12/21 1021  Vitals shown include unvalidated device data.  Last Pain:  Vitals:   11/12/21 0901  TempSrc: Oral         Complications: No notable events documented.

## 2021-11-12 NOTE — Op Note (Signed)
[facility] Patient Name: Zachary Wade Procedure Date: 11/12/2021 MRN: [id number] Attending MD: Zachary Wade , MD Date of Birth: 1956-01-22 CSN: [id number] Age: 66 Admit Type: Outpatient Procedure:                Upper GI endoscopy Indications:              Dysphagia, Stenosis of the duodenum, Follow-up of                            duodenal stenosis, For therapy of duodenal stenosis Providers:                Zachary Modena, MD, Tillie Fantasia, RN, [name]                            Technician, Technician Referring MD:              Medicines:                Monitored Anesthesia Care Complications:            No immediate complications. Estimated Blood Loss:     Estimated blood loss: none. Procedure:                Pre-Anesthesia Assessment:                           - Prior to the procedure, a History and Physical                            was performed, and patient medications and                            allergies were reviewed. The patient's tolerance of                            previous anesthesia was also reviewed. The risks                            and benefits of the procedure and the sedation                            options and risks were discussed with the patient.                            All questions were answered, and informed consent                            was obtained. Prior Anticoagulants: The patient has                            taken no previous anticoagulant or antiplatelet                            agents. ASA Grade Assessment: II - A patient with  mild systemic disease. After reviewing the risks                            and benefits, the patient was deemed in                            satisfactory condition to undergo the procedure.                           After obtaining informed consent, the endoscope was                            passed under direct vision. Throughout the                             procedure, the patient's blood pressure, pulse, and                            oxygen saturations were monitored continuously. The                            GIF-H190 ([id number]) Olympus endoscope was introduced                            through the mouth, and advanced to the third part                            of duodenum. The upper GI endoscopy was                            accomplished without difficulty. The patient                            tolerated the procedure well. Scope In: Scope Out: Findings:      Mucosal changes including feline appearance, longitudinal furrows,       congestion (edema), crepe paper esophagus and longitudinal markings were       found in the entire esophagus. Biopsies were obtained from the proximal       and distal esophagus with cold forceps for histology of suspected       eosinophilic esophagitis.      One benign-appearing, intrinsic mild stenosis was found. The stenosis       was traversed.      The entire examined stomach was normal.      An acquired benign-appearing, intrinsic moderate stenosis was found in       the duodenal bulb and was traversed. A TTS dilator was passed through       the scope. Dilation with a 6-7-8 mm and a 07-23-11 mm pyloric balloon       dilator was performed, using wire-guided balloon dilator under       fluoroscopic guidance.      The exam of the duodenum was otherwise normal. Impression:               - Esophageal mucosal changes suggestive of  eosinophilic esophagitis. Biopsied.                           - Benign-appearing esophageal stenosis.                           - Normal stomach.                           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                            Scan residual food in stomach, thus I suspect                            patient's duodenal stricture is not causing                            signficant durable obstructive symptoms; he had one                             episode in November, but minimal problems since                            then. Moderate Sedation:      Not Applicable - Patient had care per Anesthesia. Recommendation:           - Discharge patient to home (via wheelchair).                           - Soft diet today.                           - Continue present medications.                           - Await pathology results. If EOE seen, start                            fluticasone therapy; if not successful re:                            dysphagia, would then consider repeat endoscopy for                            esophageal dilatation.                           - Return to GI clinic in 4 months.                           - Return to referring physician as previously                            scheduled. Procedure Code(s):        --- Professional ---  [id number], Esophagogastroduodenoscopy, flexible,                            transoral; with dilation of gastric/duodenal                            stricture(s) (eg, balloon, bougie)                           43239, 59, Esophagogastroduodenoscopy, flexible,                            transoral; with biopsy, single or multiple Diagnosis Code(s):        --- Professional ---                           K22.8, Other specified diseases of esophagus                           K22.2, Esophageal obstruction                           K31.5, Obstruction of duodenum                           R13.10, Dysphagia, unspecified CPT copyright 2019 American Medical Association. All rights reserved. The codes documented in this report are preliminary and upon coder review may  be revised to meet current compliance requirements. Zachary ModenaWilliam Jeania Nater, MD 11/12/2021 10:36:51 AM This report has been signed electronically. Number of Addenda: 0

## 2021-11-13 ENCOUNTER — Encounter (HOSPITAL_COMMUNITY): Payer: Self-pay | Admitting: Gastroenterology

## 2021-11-13 LAB — SURGICAL PATHOLOGY

## 2022-04-09 ENCOUNTER — Encounter (HOSPITAL_COMMUNITY): Payer: Self-pay | Admitting: Gastroenterology

## 2022-04-09 ENCOUNTER — Other Ambulatory Visit: Payer: Self-pay | Admitting: Gastroenterology

## 2022-04-15 ENCOUNTER — Ambulatory Visit (HOSPITAL_COMMUNITY): Payer: Medicare Other | Admitting: Anesthesiology

## 2022-04-15 ENCOUNTER — Encounter (HOSPITAL_COMMUNITY): Payer: Self-pay | Admitting: Gastroenterology

## 2022-04-15 ENCOUNTER — Encounter (HOSPITAL_COMMUNITY)
Admission: RE | Disposition: A | Discharge status: Home / Self Care | Payer: Self-pay | Source: Home / Self Care | Attending: Gastroenterology

## 2022-04-15 ENCOUNTER — Ambulatory Visit (HOSPITAL_BASED_OUTPATIENT_CLINIC_OR_DEPARTMENT_OTHER): Payer: Medicare Other | Admitting: Anesthesiology

## 2022-04-15 ENCOUNTER — Ambulatory Visit (HOSPITAL_COMMUNITY)
Admission: RE | Admit: 2022-04-15 | Discharge: 2022-04-15 | Disposition: A | Discharge status: Home / Self Care | Payer: Medicare Other | Attending: Gastroenterology | Admitting: Gastroenterology

## 2022-04-15 ENCOUNTER — Other Ambulatory Visit: Payer: Self-pay

## 2022-04-15 DIAGNOSIS — K315 Obstruction of duodenum: Secondary | ICD-10-CM | POA: Diagnosis not present

## 2022-04-15 DIAGNOSIS — I10 Essential (primary) hypertension: Secondary | ICD-10-CM | POA: Insufficient documentation

## 2022-04-15 DIAGNOSIS — K2970 Gastritis, unspecified, without bleeding: Secondary | ICD-10-CM

## 2022-04-15 DIAGNOSIS — K222 Esophageal obstruction: Secondary | ICD-10-CM | POA: Diagnosis not present

## 2022-04-15 DIAGNOSIS — K219 Gastro-esophageal reflux disease without esophagitis: Secondary | ICD-10-CM | POA: Diagnosis not present

## 2022-04-15 DIAGNOSIS — K200 Eosinophilic esophagitis: Secondary | ICD-10-CM | POA: Insufficient documentation

## 2022-04-15 DIAGNOSIS — R1310 Dysphagia, unspecified: Secondary | ICD-10-CM | POA: Insufficient documentation

## 2022-04-15 HISTORY — PX: BALLOON DILATION: SHX5330

## 2022-04-15 HISTORY — PX: ESOPHAGOGASTRODUODENOSCOPY (EGD) WITH PROPOFOL: SHX5813

## 2022-04-15 SURGERY — ESOPHAGOGASTRODUODENOSCOPY (EGD) WITH PROPOFOL
Anesthesia: Monitor Anesthesia Care | Laterality: Bilateral

## 2022-04-15 MED ORDER — PROPOFOL 500 MG/50ML IV EMUL
INTRAVENOUS | Status: AC
  Filled 2022-04-15: qty 50

## 2022-04-15 MED ORDER — PROPOFOL 10 MG/ML IV BOLUS
INTRAVENOUS | Status: DC | PRN
  Administered 2022-04-15: 50 mg via INTRAVENOUS

## 2022-04-15 MED ORDER — LIDOCAINE HCL (CARDIAC) PF 100 MG/5ML IV SOSY
PREFILLED_SYRINGE | INTRAVENOUS | Status: DC | PRN
  Administered 2022-04-15 (×2): 100 mg via INTRAVENOUS

## 2022-04-15 MED ORDER — PROPOFOL 500 MG/50ML IV EMUL
INTRAVENOUS | Status: DC | PRN
  Administered 2022-04-15: 75 ug/kg/min via INTRAVENOUS

## 2022-04-15 MED ORDER — LACTATED RINGERS IV SOLN: INTRAVENOUS | Status: DC

## 2022-04-15 SURGICAL SUPPLY — 15 items

## 2022-04-15 NOTE — H&P (Signed)
[facility] H/P Note  Chief Complaint: dysphagia   HPI: Zachary Wade is an 66 y.o. male.  History of EOE and dysphagia.  Symptoms dysphagia persistent despite fluticasone.  Has dominant stricture at GE junction on last endoscopy.  Past Medical History:  Diagnosis Date   Complication of anesthesia    difficulty urinating   GERD (gastroesophageal reflux disease)    Hypertension     Past Surgical History:  Procedure Laterality Date   APPENDECTOMY  1967   BACK SURGERY     fusion c4,5,6   BALLOON DILATION N/A 11/13/2020   Procedure: BALLOON DILATION;  Surgeon: Willis Modena, MD;  Location: [facility];  Service: Endoscopy;  Laterality: N/A;   BALLOON DILATION N/A 12/11/2020   Procedure: BALLOON DILATION;  Surgeon: Willis Modena, MD;  Location: [facility];  Service: Endoscopy;  Laterality: N/A;   BALLOON DILATION N/A 02/12/2021   Procedure: BALLOON DILATION;  Surgeon: Willis Modena, MD;  Location: [facility];  Service: Endoscopy;  Laterality: N/A;   BIOPSY  11/13/2020   Procedure: BIOPSY;  Surgeon: Willis Modena, MD;  Location: [facility];  Service: Endoscopy;;   BIOPSY  11/12/2021   Procedure: BIOPSY;  Surgeon: Willis Modena, MD;  Location: [facility];  Service: Gastroenterology;;   ESOPHAGEAL DILATION  11/12/2021   Procedure: ESOPHAGEAL DILATION;  Surgeon: Willis Modena, MD;  Location: [facility];  Service: Gastroenterology;;   ESOPHAGOGASTRODUODENOSCOPY (EGD) WITH PROPOFOL N/A 11/13/2020   Procedure: ESOPHAGOGASTRODUODENOSCOPY (EGD) WITH PROPOFOL;  Surgeon: Willis Modena, MD;  Location: [facility];  Service: Endoscopy;  Laterality: N/A;   ESOPHAGOGASTRODUODENOSCOPY (EGD) WITH PROPOFOL N/A 12/11/2020   Procedure: ESOPHAGOGASTRODUODENOSCOPY (EGD) WITH PROPOFOL;  Surgeon: Willis Modena, MD;  Location: [facility];  Service: Endoscopy;  Laterality: N/A;   ESOPHAGOGASTRODUODENOSCOPY (EGD) WITH PROPOFOL N/A 02/12/2021   Procedure: ESOPHAGOGASTRODUODENOSCOPY (EGD)  WITH PROPOFOL;  Surgeon: Willis Modena, MD;  Location: [facility];  Service: Endoscopy;  Laterality: N/A;   ESOPHAGOGASTRODUODENOSCOPY (EGD) WITH PROPOFOL N/A 11/12/2021   Procedure: ESOPHAGOGASTRODUODENOSCOPY (EGD) WITH PROPOFOL;  Surgeon: Willis Modena, MD;  Location: [facility];  Service: Gastroenterology;  Laterality: N/A;   SPINE SURGERY  2014   C5-C7    Medications Prior to Admission  Medication Sig Dispense Refill   amLODipine (NORVASC) 5 MG tablet Take 5 mg by mouth daily.     Ferrous Sulfate (IRON SUPPLEMENT PO) Take 15 mLs by mouth daily.     fluticasone (FLOVENT HFA) 220 MCG/ACT inhaler Inhale 1 puff into the lungs 2 (two) times daily.     HYDROcodone-acetaminophen (NORCO/VICODIN) 5-325 MG per tablet Take 1 tablet by mouth 3 (three) times daily.     LORazepam (ATIVAN) 1 MG tablet Take 1 mg by mouth 2 (two) times daily.     Multiple Vitamins-Minerals (MULTIVITAMIN WITH MINERALS) tablet Take 1 tablet by mouth daily.     pantoprazole (PROTONIX) 40 MG tablet Take 40 mg by mouth daily as needed (acid reflux).     ramipril (ALTACE) 5 MG capsule Take 5 mg by mouth in the morning.     sucralfate (CARAFATE) 1 g tablet Take 1 g by mouth daily as needed (acid reflux).      Allergies:  Allergies  Allergen Reactions   Valsartan     Headaches     History reviewed. No pertinent family history.  Social History:  reports that he has never smoked. He has never used smokeless tobacco. He reports that he does not currently use alcohol. He reports that he does not use drugs.   ROS:  As per HPI, all others negative   Blood pressure (!) 162/94, temperature 99 F (37.2 C), temperature source Temporal, resp. rate 17, height 5\' 10"  (1.778 m), weight 74.8 kg, SpO2 95 %. General appearance: NAD HEENT:  [place]/AT, anicteric NECK:  Supple, no JVD CV:  Regular ABD:  Soft, non-tender NEURO:  A/O, no encephalopathy, non-focal  No results found for this or any previous visit (from the past 48  hour(s)). No results found.  Assessment/Plan   Dysphagia. 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 Plan EGD +/- DIL. Risks (bleeding, infection, bowel perforation that could require surgery, sedation-related changes in cardiopulmonary systems), benefits (identification and possible treatment of source of symptoms, exclusion of certain causes of symptoms), and alternatives (watchful waiting, radiographic imaging studies, empiric medical treatment) of upper endoscopy (EGD) were explained to patient/family in detail and patient wishes to proceed.   04/15/2022, 10:26 AM

## 2022-04-15 NOTE — Discharge Instructions (Signed)
YOU HAD AN ENDOSCOPIC PROCEDURE TODAY: Refer to the procedure report and other information in the discharge instructions given to you for any specific questions about what was found during the examination. If this information does not answer your questions, please call the [facility] office at [phone] to clarify.   YOU SHOULD EXPECT: Some feelings of bloating in the abdomen. Passage of more gas than usual. Walking can help get rid of the air that was put into your GI tract during the procedure and reduce the bloating.  DIET: Your first meal following the procedure should be a light meal and then it is ok to progress to your normal diet. A half-sandwich or bowl of soup is an example of a good first meal. Heavy or fried foods are harder to digest and may make you feel nauseous or bloated. Drink plenty of fluids but you should avoid alcoholic beverages for 24 hours.   ACTIVITY: Your care partner should take you home directly after the procedure. You should plan to take it easy, moving slowly for the rest of the day. You can resume normal activity the day after the procedure however YOU SHOULD NOT DRIVE, use power tools, machinery or perform tasks that involve climbing or major physical exertion for 24 hours (because of the sedation medicines used during the test).   SYMPTOMS TO REPORT IMMEDIATELY: A gastroenterologist can be reached at any hour. Please call [phone]  for any of the following symptoms:   Following upper endoscopy (EGD, EUS, ERCP, esophageal dilation) Vomiting of blood or coffee ground material  New, significant abdominal pain  New, significant chest pain or pain under the shoulder blades  Painful or persistently difficult swallowing  New shortness of breath  Black, tarry-looking or red, bloody stools  FOLLOW UP:  If any biopsies were taken you will be contacted by phone or by letter within the next 1-3 weeks. Call [phone]  if you have not heard about the biopsies in 3  weeks.  Please also call with any specific questions about appointments or follow up tests. 

## 2022-04-15 NOTE — Transfer of Care (Signed)
Immediate Anesthesia Transfer of Care Note  Patient: Zachary Wade  Procedure(s) Performed: ESOPHAGOGASTRODUODENOSCOPY (EGD) WITH PROPOFOL (Bilateral) BALLOON DILATION (Bilateral)  Patient Location: PACU  Anesthesia Type:MAC  Level of Consciousness: sedated  Airway & Oxygen Therapy: Patient Spontanous Breathing and Patient connected to face mask oxygen  Post-op Assessment: Report given to RN and Post -op Vital signs reviewed and stable  Post vital signs: Reviewed and stable  Last Vitals:  Vitals Value Taken Time  BP 127/83 04/15/22 1056  Temp    Pulse 74 04/15/22 1057  Resp 18 04/15/22 1057  SpO2 99 % 04/15/22 1057  Vitals shown include unvalidated device data.  Last Pain:  Vitals:   04/15/22 1056  TempSrc:   PainSc: 0-No pain         Complications: No notable events documented.

## 2022-04-15 NOTE — Op Note (Signed)
[facility] Patient Name: Zachary Wade Procedure Date: 04/15/2022 MRN: [id number] Attending MD: Willis Modena , MD Date of Birth: 14-Oct-1955 CSN: [id number] Age: 66 Admit Type: Inpatient Procedure:                Upper GI endoscopy Indications:              Dysphagia, Eosinophilic esophagitis Providers:                Willis Modena, MD, Benjaman Lobe, RN, Priscella Mann,                            Technician Referring MD:             Rodrigo Ran, MD Medicines:                Monitored Anesthesia Care Complications:            No immediate complications. Estimated Blood Loss:     Estimated blood loss: none. Procedure:                Pre-Anesthesia Assessment:                           - Prior to the procedure, a History and Physical                            was performed, and patient medications and                            allergies were reviewed. The patient's tolerance of                            previous anesthesia was also reviewed. The risks                            and benefits of the procedure and the sedation                            options and risks were discussed with the patient.                            All questions were answered, and informed consent                            was obtained. Prior Anticoagulants: The patient has                            taken no previous anticoagulant or antiplatelet                            agents. ASA Grade Assessment: II - A patient with                            mild systemic disease. After reviewing the risks  and benefits, the patient was deemed in                            satisfactory condition to undergo the procedure.                           After obtaining informed consent, the endoscope was                            passed under direct vision. Throughout the                            procedure, the patient's blood pressure, pulse, and                             oxygen saturations were monitored continuously. The                            GIF-H190 ([id number] Olympus endoscope was introduced                            through the mouth, and advanced to the second part                            of duodenum. The upper GI endoscopy was                            accomplished without difficulty. The patient                            tolerated the procedure well. Scope In: Scope Out: Findings:      Mucosal changes were found in the middle third of the esophagus and in       the lower third of the esophagus.      One benign-appearing, intrinsic moderate stenosis was found. The       stenosis was traversed. A TTS dilator was passed through the scope.       Dilation with an 05-20-09 mm balloon and a 07-23-11 mm balloon dilator was       performed to 12 mm.      The exam of the esophagus was otherwise normal.      Patchy mild inflammation was found in the gastric fundus, on the       anterior wall of the stomach and in the gastric antrum.      The exam of the stomach was otherwise normal.      An acquired benign-appearing, intrinsic mild stenosis (previously seen       and dilated on multiple occasions) was found in the duodenal bulb and       was traversed.      The exam of the duodenum was otherwise normal. Impression:               - Esophageal mucosal changes secondary to                            eosinophilic esophagitis.                           -  Benign-appearing esophageal stenosis. Dilated.                           - Gastritis.                           - Acquired duodenal stenosis. Moderate Sedation:      Not Applicable - Patient had care per Anesthesia. Recommendation:           - Discharge patient to home (via wheelchair).                           - Resume previous diet today.                           - Continue present medications.                           - Return to GI clinic in 3 months.                           - Return to  referring physician as previously                            scheduled. Procedure Code(s):        --- Professional ---                           [phone], Esophagogastroduodenoscopy, flexible,                            transoral; with transendoscopic balloon dilation of                            esophagus (less than 30 mm diameter) Diagnosis Code(s):        --- Professional ---                           K20.0, Eosinophilic esophagitis                           K22.2, Esophageal obstruction                           K29.70, Gastritis, unspecified, without bleeding                           K31.5, Obstruction of duodenum                           R13.10, Dysphagia, unspecified CPT copyright 2019 American Medical Association. All rights reserved. The codes documented in this report are preliminary and upon coder review may  be revised to meet current compliance requirements. Willis Modena, MD 04/15/2022 11:19:45 AM This report has been signed electronically. Number of Addenda: 0

## 2022-04-15 NOTE — Anesthesia Postprocedure Evaluation (Signed)
Anesthesia Post Note  Patient: Zachary Wade  Procedure(s) Performed: ESOPHAGOGASTRODUODENOSCOPY (EGD) WITH PROPOFOL (Bilateral) BALLOON DILATION (Bilateral)     Patient location during evaluation: Endoscopy Anesthesia Type: MAC Level of consciousness: awake and alert Pain management: pain level controlled Vital Signs Assessment: post-procedure vital signs reviewed and stable Respiratory status: spontaneous breathing, nonlabored ventilation, respiratory function stable and patient connected to nasal cannula oxygen Cardiovascular status: blood pressure returned to baseline and stable Postop Assessment: no apparent nausea or vomiting Anesthetic complications: no   No notable events documented.  Last Vitals:  Vitals:   04/15/22 1106 04/15/22 1115  BP: 122/90 136/88  Pulse: 76 76  Resp: 18 17  Temp:    SpO2: 95% 96%    Last Pain:  Vitals:   04/15/22 1115  TempSrc:   PainSc: 0-No pain                 Khairi Garman L Lety Cullens

## 2022-04-15 NOTE — Anesthesia Preprocedure Evaluation (Addendum)
Anesthesia Evaluation  Patient identified by MRN, date of birth, ID band Patient awake    Reviewed: Allergy & Precautions, NPO status , Patient's Chart, lab work & pertinent test results  Airway Mallampati: III  TM Distance: >3 FB Neck ROM: Full  Mouth opening: Limited Mouth Opening  Dental no notable dental hx. (+) Teeth Intact, Dental Advisory Given   Pulmonary neg pulmonary ROS,    Pulmonary exam normal breath sounds clear to auscultation       Cardiovascular hypertension, Pt. on medications Normal cardiovascular exam Rhythm:Regular Rate:Normal     Neuro/Psych negative neurological ROS  negative psych ROS   GI/Hepatic Neg liver ROS, GERD  ,  Endo/Other  negative endocrine ROS  Renal/GU negative Renal ROS  negative genitourinary   Musculoskeletal negative musculoskeletal ROS (+)   Abdominal   Peds  Hematology negative hematology ROS (+)   Anesthesia Other Findings   Reproductive/Obstetrics                            Anesthesia Physical Anesthesia Plan  ASA: 2  Anesthesia Plan: MAC   Post-op Pain Management:    Induction: Intravenous  PONV Risk Score and Plan: Propofol infusion and Treatment may vary due to age or medical condition  Airway Management Planned: Natural Airway  Additional Equipment:   Intra-op Plan:   Post-operative Plan:   Informed Consent: I have reviewed the patients History and Physical, chart, labs and discussed the procedure including the risks, benefits and alternatives for the proposed anesthesia with the patient or authorized representative who has indicated his/her understanding and acceptance.     Dental advisory given  Plan Discussed with: CRNA  Anesthesia Plan Comments:         Anesthesia Quick Evaluation

## 2022-04-16 ENCOUNTER — Encounter (HOSPITAL_COMMUNITY): Payer: Self-pay | Admitting: Gastroenterology

## 2022-10-29 IMAGING — CT CT ABD-PELV W/ CM
1 of 3 series · 13 of 32 positions shown, 19 images · IV contrast (APPLIED)
Comparison: None.

CLINICAL DATA: Duodenum stricture.  Abdominal distension.

EXAM:
CT ABDOMEN AND PELVIS WITH CONTRAST
TECHNIQUE: Multidetector CT imaging of the abdomen and pelvis was performed
using the standard protocol following bolus administration of
intravenous contrast.
CONTRAST:  100mL XLB2P7-P99 IOPAMIDOL (XLB2P7-P99) INJECTION 61%

[Series 2: abd/pelvis w/cm · axial · 0.72mm/px · z∈[-438,-78]mm · 13 of 84 slices shown, 19 images]
[im 6/84  soft-tissue]
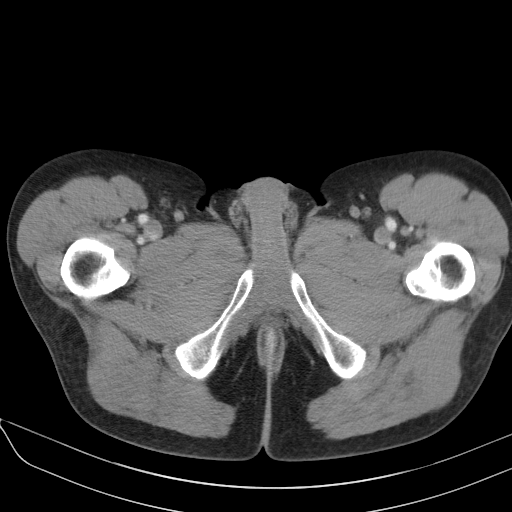
[im 6/84  bone]
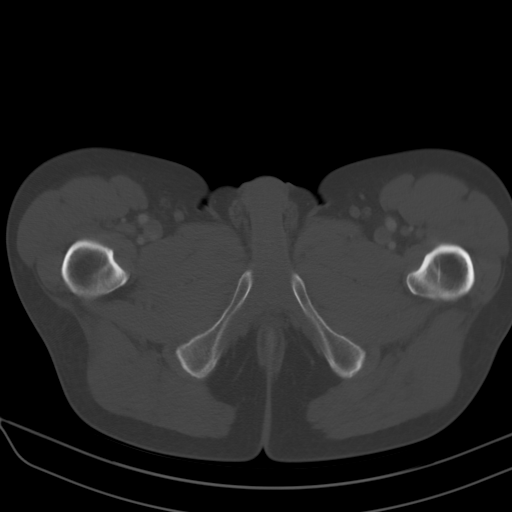
[im 12/84  soft-tissue]
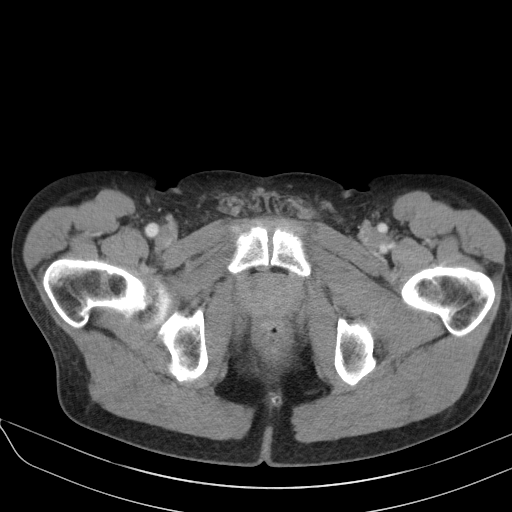
[im 18/84  soft-tissue]
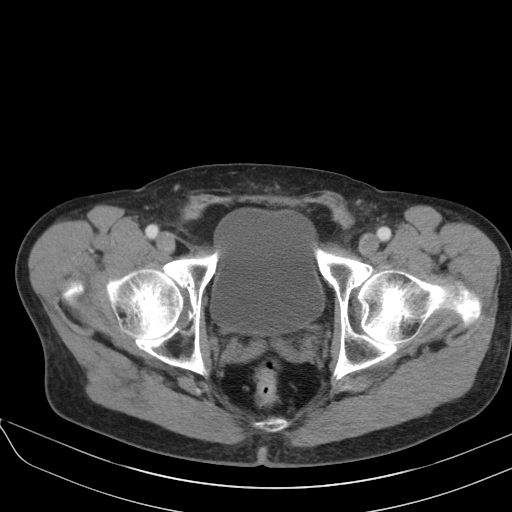
[im 24/84  soft-tissue]
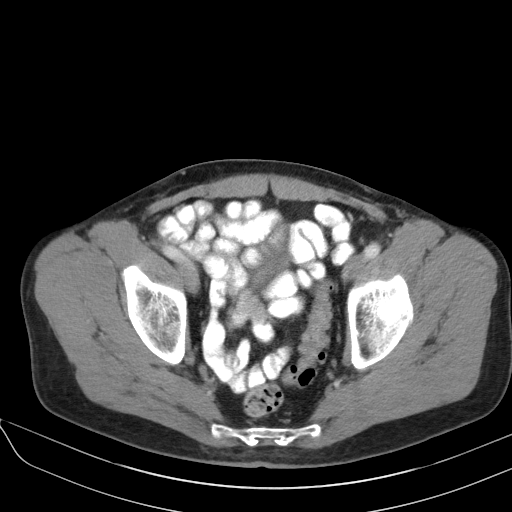
[im 30/84  soft-tissue]
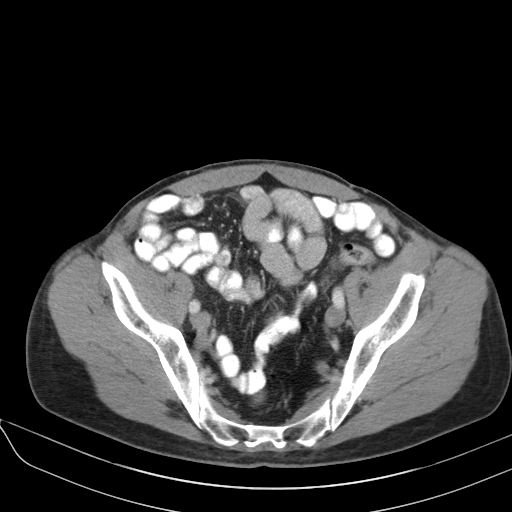
[im 36/84  soft-tissue]
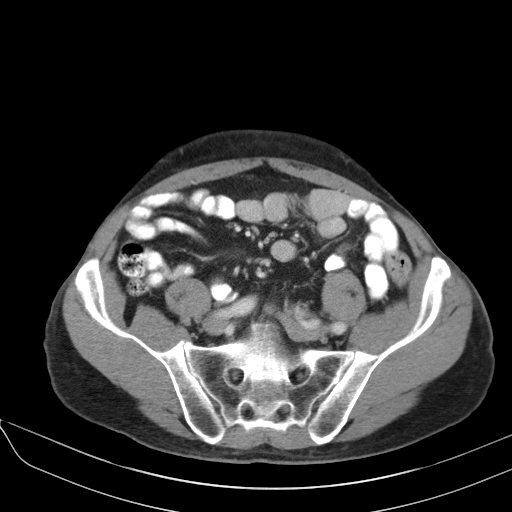
[im 42/84  soft-tissue]
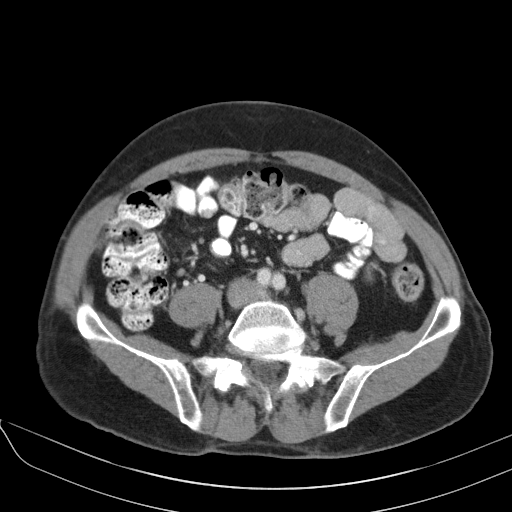
[im 48/84  soft-tissue]
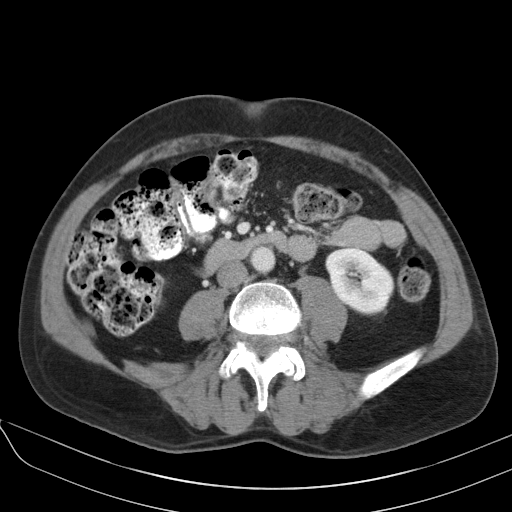
[im 54/84  soft-tissue]
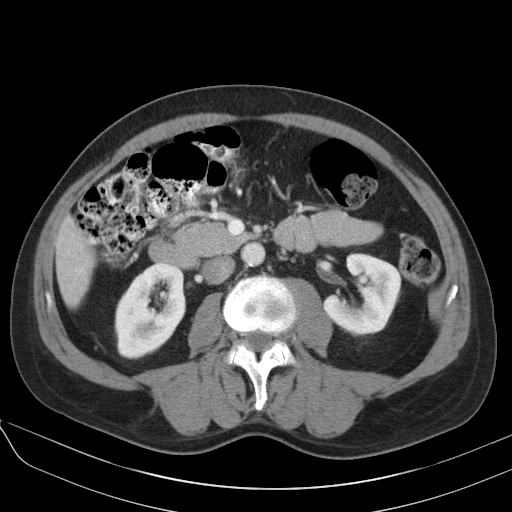
[im 54/84  bone]
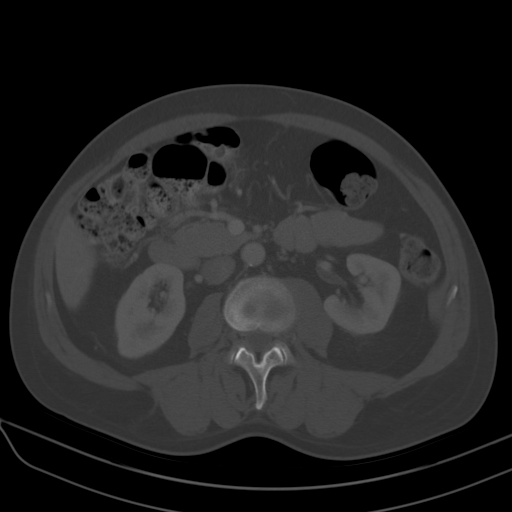
[im 60/84  soft-tissue]
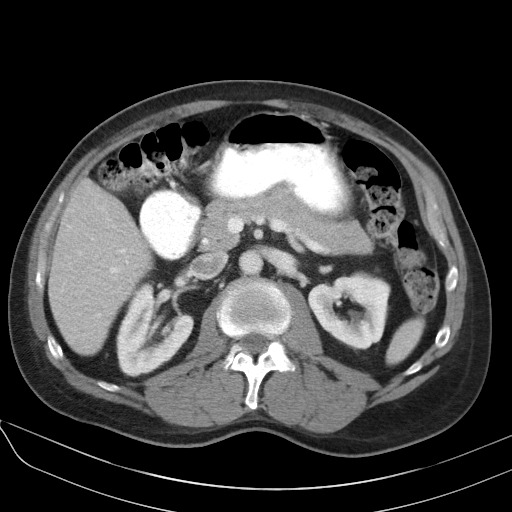
[im 60/84  lung]
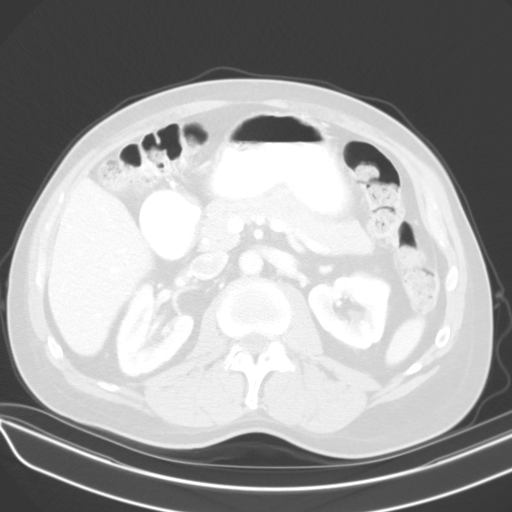
[im 66/84  soft-tissue]
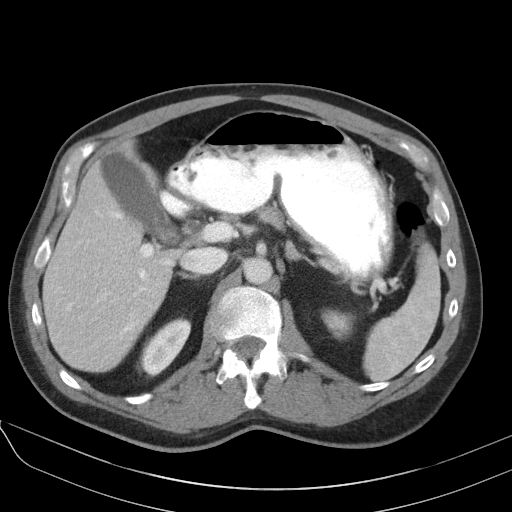
[im 66/84  lung]
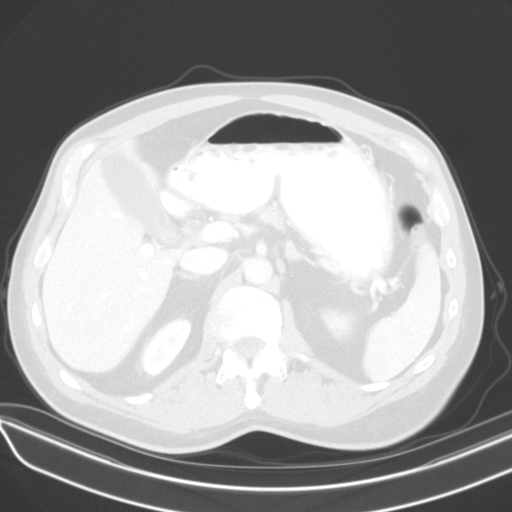
[im 72/84  soft-tissue]
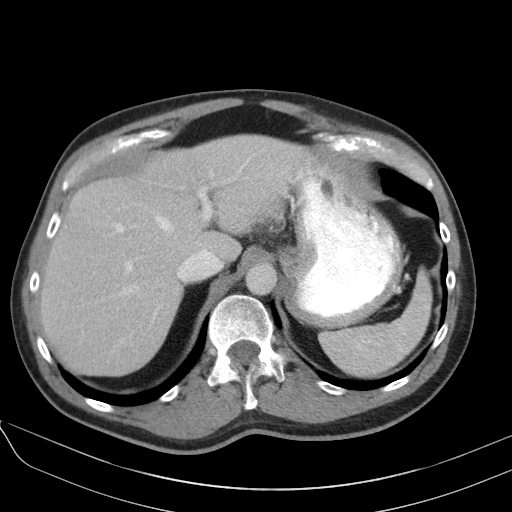
[im 72/84  lung]
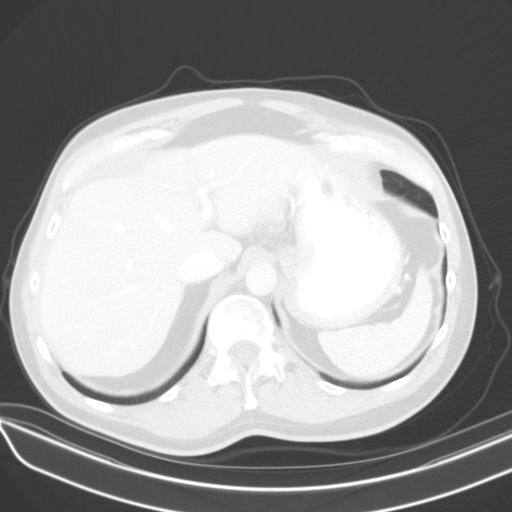
[im 78/84  soft-tissue]
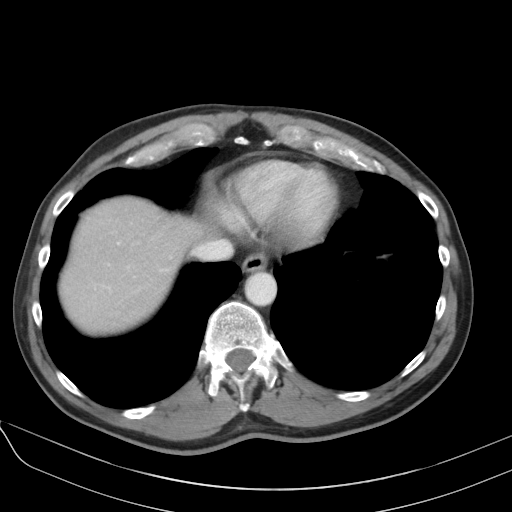
[im 78/84  lung]
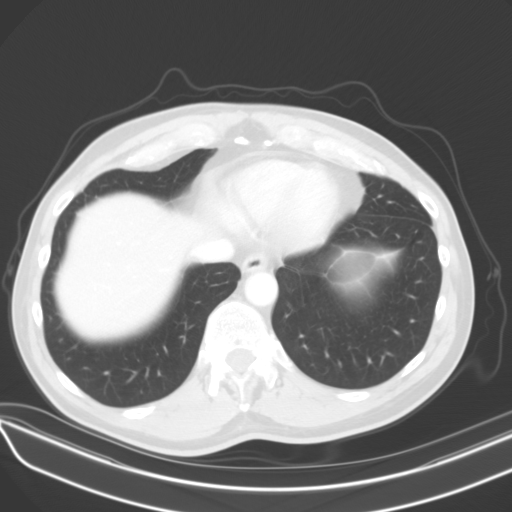

[13 of 32 positions shown; findings below may reference images not displayed]

FINDINGS: Lower chest: Lung bases are clear.

Hepatobiliary: No focal hepatic lesion. Several small gallstones
noted. No cholecystitis.

There is high-density material within the common bile duct (image
[DATE]). Extrahepatic bile duct dilatation.

Pancreas: Pancreas is normal. No ductal dilatation. No pancreatic
inflammation.

Spleen: Normal spleen

Adrenals/urinary tract: Adrenal glands and kidneys are normal. The
ureters and bladder normal.

Stomach/Bowel: Stomach is normal. The duodenal bulb is distended by
oral contrast. No contrast is evident within the second and third
portion the duodenum however contrast is present in the distal small
bowel and colon. Colorectal colon normal.

Vascular/Lymphatic: Abdominal aorta is normal caliber. No periportal
or retroperitoneal adenopathy. No pelvic adenopathy.

Reproductive:

Other: No free fluid.

Musculoskeletal: No aggressive osseous lesion.
IMPRESSION: 1. No discrete duodenal stricture is noted however there is retained
oral contrast with the duodenal bulb and no contrast within the
duodenum at the timing of imaging. This could indicate duodenal
stricture.
2. No evidence of bowel obstruction as contrast flows through the
entirety of the small bowel and into the colon.
3. High-density material fills the common bile duct. Differential
include reflux of oral contrast in the common bile duct versus
sludge in the gallbladder. If duodenal stricture distal the ampulla,
reflux into common bile duct could be hydrostatic.
4. Multiple small gallstones without evidence acute cholecystitis.

## 2023-07-26 ENCOUNTER — Other Ambulatory Visit: Payer: Self-pay | Admitting: Gastroenterology

## 2023-07-27 ENCOUNTER — Encounter (HOSPITAL_COMMUNITY): Payer: Self-pay | Admitting: Gastroenterology

## 2023-07-28 ENCOUNTER — Ambulatory Visit (HOSPITAL_COMMUNITY)
Admission: RE | Admit: 2023-07-28 | Discharge: 2023-07-28 | Disposition: A | Discharge status: Home / Self Care | Payer: Medicare Other | Attending: Gastroenterology | Admitting: Gastroenterology

## 2023-07-28 ENCOUNTER — Encounter (HOSPITAL_COMMUNITY)
Admission: RE | Disposition: A | Discharge status: Home / Self Care | Payer: Self-pay | Source: Home / Self Care | Attending: Gastroenterology

## 2023-07-28 ENCOUNTER — Other Ambulatory Visit: Payer: Self-pay

## 2023-07-28 ENCOUNTER — Ambulatory Visit (HOSPITAL_COMMUNITY): Payer: Medicare Other | Admitting: Certified Registered"

## 2023-07-28 ENCOUNTER — Ambulatory Visit (HOSPITAL_COMMUNITY): Payer: Medicare Other

## 2023-07-28 ENCOUNTER — Encounter (HOSPITAL_COMMUNITY): Payer: Self-pay | Admitting: Gastroenterology

## 2023-07-28 ENCOUNTER — Ambulatory Visit (HOSPITAL_BASED_OUTPATIENT_CLINIC_OR_DEPARTMENT_OTHER): Payer: Medicare Other | Admitting: Certified Registered"

## 2023-07-28 DIAGNOSIS — K315 Obstruction of duodenum: Secondary | ICD-10-CM | POA: Diagnosis not present

## 2023-07-28 DIAGNOSIS — K56699 Other intestinal obstruction unspecified as to partial versus complete obstruction: Secondary | ICD-10-CM | POA: Insufficient documentation

## 2023-07-28 DIAGNOSIS — K219 Gastro-esophageal reflux disease without esophagitis: Secondary | ICD-10-CM | POA: Diagnosis not present

## 2023-07-28 DIAGNOSIS — K3189 Other diseases of stomach and duodenum: Secondary | ICD-10-CM | POA: Diagnosis not present

## 2023-07-28 DIAGNOSIS — K200 Eosinophilic esophagitis: Secondary | ICD-10-CM

## 2023-07-28 DIAGNOSIS — R6881 Early satiety: Secondary | ICD-10-CM | POA: Diagnosis not present

## 2023-07-28 HISTORY — PX: ESOPHAGOGASTRODUODENOSCOPY (EGD) WITH PROPOFOL: SHX5813

## 2023-07-28 HISTORY — PX: BALLOON DILATION: SHX5330

## 2023-07-28 HISTORY — PX: BIOPSY: SHX5522

## 2023-07-28 SURGERY — ESOPHAGOGASTRODUODENOSCOPY (EGD) WITH PROPOFOL
Anesthesia: Monitor Anesthesia Care

## 2023-07-28 MED ORDER — PROPOFOL 500 MG/50ML IV EMUL
INTRAVENOUS | Status: AC
  Filled 2023-07-28: qty 50

## 2023-07-28 MED ORDER — PROPOFOL 10 MG/ML IV BOLUS
INTRAVENOUS | Status: DC | PRN
  Administered 2023-07-28 (×2): 30 mg via INTRAVENOUS
  Administered 2023-07-28: 20 mg via INTRAVENOUS

## 2023-07-28 MED ORDER — PROPOFOL 500 MG/50ML IV EMUL
INTRAVENOUS | Status: DC | PRN
  Administered 2023-07-28: 135 ug/kg/min via INTRAVENOUS

## 2023-07-28 SURGICAL SUPPLY — 15 items
BLOCK BITE 60FR ADLT L/F BLUE (MISCELLANEOUS) ×3 IMPLANT
ELECT REM PT RETURN 9FT ADLT (ELECTROSURGICAL)
ELECTRODE REM PT RTRN 9FT ADLT (ELECTROSURGICAL) IMPLANT
FORCEP RJ3 GP 1.8X160 W-NEEDLE (CUTTING FORCEPS) IMPLANT
FORCEPS BIOP RAD 4 LRG CAP 4 (CUTTING FORCEPS) IMPLANT
NDL SCLEROTHERAPY 25GX240 (NEEDLE) IMPLANT
NEEDLE SCLEROTHERAPY 25GX240 (NEEDLE)
PROBE APC STR FIRE (PROBE) IMPLANT
PROBE INJECTION GOLD (MISCELLANEOUS)
PROBE INJECTION GOLD 7FR (MISCELLANEOUS) IMPLANT
SNARE SHORT THROW 13M SML OVAL (MISCELLANEOUS) IMPLANT
SYR 50ML LL SCALE MARK (SYRINGE) IMPLANT
TUBING ENDO SMARTCAP PENTAX (MISCELLANEOUS) ×6 IMPLANT
TUBING IRRIGATION ENDOGATOR (MISCELLANEOUS) ×3 IMPLANT
WATER STERILE IRR 1000ML POUR (IV SOLUTION) IMPLANT

## 2023-07-28 NOTE — Discharge Instructions (Addendum)
YOU HAD AN ENDOSCOPIC PROCEDURE TODAY: Refer to the procedure report and other information in the discharge instructions given to you for any specific questions about what was found during the examination. If this information does not answer your questions, please call [facility] office at [phone] to clarify.   YOU SHOULD EXPECT: Some feelings of bloating in the abdomen. Passage of more gas than usual. Walking can help get rid of the air that was put into your GI tract during the procedure and reduce the bloating. If you had a lower endoscopy (such as a colonoscopy or flexible sigmoidoscopy) you may notice spotting of blood in your stool or on the toilet paper. Some abdominal soreness may be present for a day or two, also.  DIET: Your first meal following the procedure should be a light meal and then it is ok to progress to your regular soft diet and not eating large meals at a time.  A half-sandwich or bowl of soup is an example of a good first meal. Heavy or fried foods are harder to digest and may make you feel nauseous or bloated. Drink plenty of fluids but you should avoid alcoholic beverages for 24 hours. If you had a esophageal dilation, please see attached instructions for diet.    ACTIVITY: Your care partner should take you home directly after the procedure. You should plan to take it easy, moving slowly for the rest of the day. You can resume normal activity the day after the procedure however YOU SHOULD NOT DRIVE, use power tools, machinery or perform tasks that involve climbing or major physical exertion for 24 hours (because of the sedation medicines used during the test).   SYMPTOMS TO REPORT IMMEDIATELY: A gastroenterologist can be reached at any hour. Please call [phone]  for any of the following symptoms:  Following lower endoscopy (colonoscopy, flexible sigmoidoscopy) Excessive amounts of blood in the stool  Significant tenderness, worsening of abdominal pains  Swelling of the  abdomen that is new, acute  Fever of 100 or higher  Following upper endoscopy (EGD, EUS, ERCP, esophageal dilation) Vomiting of blood or coffee ground material  New, significant abdominal pain  New, significant chest pain or pain under the shoulder blades  Painful or persistently difficult swallowing  New shortness of breath  Black, tarry-looking or red, bloody stools  FOLLOW UP:  If any biopsies were taken you will be contacted by phone or by letter within the next 1-3 weeks. Call [phone]  if you have not heard about the biopsies in 3 weeks.  Please also call with any specific questions about appointments or follow up tests.

## 2023-07-28 NOTE — Anesthesia Postprocedure Evaluation (Signed)
Anesthesia Post Note  Patient: KINSLEY NICKLAUS  Procedure(s) Performed: ESOPHAGOGASTRODUODENOSCOPY (EGD) WITH PROPOFOL (Bilateral) Duodenal BALLOON DILATION BIOPSY     Patient location during evaluation: PACU Anesthesia Type: MAC Level of consciousness: awake and alert Pain management: pain level controlled Vital Signs Assessment: post-procedure vital signs reviewed and stable Respiratory status: spontaneous breathing, nonlabored ventilation, respiratory function stable and patient connected to nasal cannula oxygen Cardiovascular status: stable and blood pressure returned to baseline Postop Assessment: no apparent nausea or vomiting Anesthetic complications: no   No notable events documented.  Last Vitals:  Vitals:   07/28/23 1020 07/28/23 1025  BP: 128/75 126/83  Pulse: 75 74  Resp: 14 16  Temp:    SpO2: 93% 94%    Last Pain:  Vitals:   07/28/23 1020  TempSrc:   PainSc: 0-No pain                 Mariann Barter

## 2023-07-28 NOTE — Anesthesia Preprocedure Evaluation (Signed)
Anesthesia Evaluation  Patient identified by MRN, date of birth, ID band Patient awake    Reviewed: Allergy & Precautions, NPO status , Patient's Chart, lab work & pertinent test results, reviewed documented beta blocker date and time   History of Anesthesia Complications Negative for: history of anesthetic complications  Airway Mallampati: III   Neck ROM: Limited  Mouth opening: Limited Mouth Opening  Dental no notable dental hx.    Pulmonary neg sleep apnea, neg COPD, neg PE   breath sounds clear to auscultation       Cardiovascular hypertension, (-) angina (-) CAD, (-) Past MI, (-) Cardiac Stents and (-) CABG  Rhythm:Regular Rate:Normal     Neuro/Psych neg Seizures C5-c7 fusion    GI/Hepatic ,GERD  Medicated,,(+) neg Cirrhosis        Endo/Other  neg diabetes    Renal/GU Renal disease     Musculoskeletal   Abdominal   Peds  Hematology   Anesthesia Other Findings   Reproductive/Obstetrics                              Anesthesia Physical Anesthesia Plan  ASA: 2  Anesthesia Plan: MAC   Post-op Pain Management:    Induction: Intravenous  PONV Risk Score and Plan: 1 and Ondansetron and Propofol infusion  Airway Management Planned:   Additional Equipment:   Intra-op Plan:   Post-operative Plan: Extubation in OR  Informed Consent: I have reviewed the patients History and Physical, chart, labs and discussed the procedure including the risks, benefits and alternatives for the proposed anesthesia with the patient or authorized representative who has indicated his/her understanding and acceptance.     Dental advisory given  Plan Discussed with: CRNA  Anesthesia Plan Comments:          Anesthesia Quick Evaluation

## 2023-07-28 NOTE — Anesthesia Procedure Notes (Signed)
Procedure Name: MAC Date/Time: 07/28/2023 9:15 AM  Performed by: Nelle Don, CRNAPre-anesthesia Checklist: Patient identified, Emergency Drugs available, Suction available and Patient being monitored Oxygen Delivery Method: Simple face mask

## 2023-07-28 NOTE — H&P (Signed)
[facility] H/P Note  Chief Complaint: Duodenal stricture HPI: Zachary Wade is an 67 y.o. male.  Known duodenal stricture.  Progressive early satiety.  No weight loss.  Past Medical History:  Diagnosis Date   Complication of anesthesia    difficulty urinating   GERD (gastroesophageal reflux disease)    Hypertension     Past Surgical History:  Procedure Laterality Date   APPENDECTOMY  1967   BACK SURGERY     fusion c4,5,6   BALLOON DILATION N/A 11/13/2020   Procedure: BALLOON DILATION;  Surgeon: Willis Modena, MD;  Location: [facility];  Service: Endoscopy;  Laterality: N/A;   BALLOON DILATION N/A 12/11/2020   Procedure: BALLOON DILATION;  Surgeon: Willis Modena, MD;  Location: [facility];  Service: Endoscopy;  Laterality: N/A;   BALLOON DILATION N/A 02/12/2021   Procedure: BALLOON DILATION;  Surgeon: Willis Modena, MD;  Location: [facility];  Service: Endoscopy;  Laterality: N/A;   BALLOON DILATION Bilateral 04/15/2022   Procedure: BALLOON DILATION;  Surgeon: Willis Modena, MD;  Location: [facility];  Service: Gastroenterology;  Laterality: Bilateral;   BIOPSY  11/13/2020   Procedure: BIOPSY;  Surgeon: Willis Modena, MD;  Location: [facility];  Service: Endoscopy;;   BIOPSY  11/12/2021   Procedure: BIOPSY;  Surgeon: Willis Modena, MD;  Location: [facility];  Service: Gastroenterology;;   ESOPHAGEAL DILATION  11/12/2021   Procedure: ESOPHAGEAL DILATION;  Surgeon: Willis Modena, MD;  Location: [facility];  Service: Gastroenterology;;   ESOPHAGOGASTRODUODENOSCOPY (EGD) WITH PROPOFOL N/A 11/13/2020   Procedure: ESOPHAGOGASTRODUODENOSCOPY (EGD) WITH PROPOFOL;  Surgeon: Willis Modena, MD;  Location: [facility];  Service: Endoscopy;  Laterality: N/A;   ESOPHAGOGASTRODUODENOSCOPY (EGD) WITH PROPOFOL N/A 12/11/2020   Procedure: ESOPHAGOGASTRODUODENOSCOPY (EGD) WITH PROPOFOL;  Surgeon: Willis Modena, MD;  Location: [facility];  Service: Endoscopy;  Laterality:  N/A;   ESOPHAGOGASTRODUODENOSCOPY (EGD) WITH PROPOFOL N/A 02/12/2021   Procedure: ESOPHAGOGASTRODUODENOSCOPY (EGD) WITH PROPOFOL;  Surgeon: Willis Modena, MD;  Location: [facility];  Service: Endoscopy;  Laterality: N/A;   ESOPHAGOGASTRODUODENOSCOPY (EGD) WITH PROPOFOL N/A 11/12/2021   Procedure: ESOPHAGOGASTRODUODENOSCOPY (EGD) WITH PROPOFOL;  Surgeon: Willis Modena, MD;  Location: [facility];  Service: Gastroenterology;  Laterality: N/A;   ESOPHAGOGASTRODUODENOSCOPY (EGD) WITH PROPOFOL Bilateral 04/15/2022   Procedure: ESOPHAGOGASTRODUODENOSCOPY (EGD) WITH PROPOFOL;  Surgeon: Willis Modena, MD;  Location: [facility];  Service: Gastroenterology;  Laterality: Bilateral;   SPINE SURGERY  2014   C5-C7    Medications Prior to Admission  Medication Sig Dispense Refill   amLODipine (NORVASC) 5 MG tablet Take 5 mg by mouth daily.     HYDROcodone-acetaminophen (NORCO/VICODIN) 5-325 MG per tablet Take 1 tablet by mouth 3 (three) times daily.     LORazepam (ATIVAN) 1 MG tablet Take 1 mg by mouth 2 (two) times daily.     pantoprazole (PROTONIX) 40 MG tablet Take 40 mg by mouth daily as needed (acid reflux).     ramipril (ALTACE) 5 MG capsule Take 5 mg by mouth in the morning.     sucralfate (CARAFATE) 1 g tablet Take 1 g by mouth daily as needed (acid reflux).     Ferrous Sulfate (IRON SUPPLEMENT PO) Take 15 mLs by mouth daily.     fluticasone (FLOVENT HFA) 220 MCG/ACT inhaler Inhale 1 puff into the lungs 2 (two) times daily.     Multiple Vitamins-Minerals (MULTIVITAMIN WITH MINERALS) tablet Take 1 tablet by mouth daily.      Allergies:  Allergies  Allergen Reactions   Valsartan     Headaches  History reviewed. No pertinent family history.  Social History:  reports that he has never smoked. He has never used smokeless tobacco. He reports that he does not currently use alcohol. He reports that he does not use drugs.   ROS: as per HPI all others negative   Blood pressure (!)  151/93, pulse 81, temperature 98.1 F (36.7 C), temperature source Temporal, resp. rate 18, height 5\' 10"  (1.778 m), weight 76.2 kg, SpO2 97%. General appearance: NAD HEENT:  [place]/AT CV:  Regular RESP:  No distress ABD:  Soft, non-tender NEURO:  No encephalopathy  No results found for this or any previous visit (from the past 48 hour(s)). No results found.  Assessment/Plan   Eosinophilic esophagitis, no dysphagia and clinically quiescent at present. Duodenal stenosis with progressive early satiety without vomiting or weight loss. Endoscopy with possible duodenal stricture dilatation. Risks (bleeding, infection, bowel perforation that could require surgery, sedation-related changes in cardiopulmonary systems), benefits (identification and possible treatment of source of symptoms, exclusion of certain causes of symptoms), and alternatives (watchful waiting, radiographic imaging studies, empiric medical treatment) of upper endoscopy with possible enteric dilatation (EGD) were explained to patient/family in detail and patient wishes to proceed.   Zachary Wade 07/28/2023, 9:14 AM

## 2023-07-28 NOTE — Op Note (Signed)
[facility] Patient Name: Zachary Wade Procedure Date: 07/28/2023 MRN: [id number] Attending MD: Willis Modena , MD, [id number] Date of Birth: 1956-08-20 CSN: [id number] Age: 67 Admit Type: Outpatient Procedure:                Upper GI endoscopy Indications:              Stenosis of the duodenum, Follow-up of duodenal                            stenosis, Early satiety Providers:                Willis Modena, MD, Stephens Shire RN, RN, Kandice Robinsons, Technician Referring MD:              Medicines:                Monitored Anesthesia Care Complications:            No immediate complications. Estimated Blood Loss:     Estimated blood loss: none. Procedure:                Pre-Anesthesia Assessment:                           - Prior to the procedure, a History and Physical                            was performed, and patient medications and                            allergies were reviewed. The patient's tolerance of                            previous anesthesia was also reviewed. The risks                            and benefits of the procedure and the sedation                            options and risks were discussed with the patient.                            All questions were answered, and informed consent                            was obtained. Prior Anticoagulants: The patient has                            taken no anticoagulant or antiplatelet agents. ASA                            Grade Assessment: II - A patient with mild systemic  disease. After reviewing the risks and benefits,                            the patient was deemed in satisfactory condition to                            undergo the procedure.                           After obtaining informed consent, the endoscope was                            passed under direct vision. Throughout the                            procedure, the  patient's blood pressure, pulse, and                            oxygen saturations were monitored continuously. The                            [id number]-[id number] ([id number]) Olympus endoscope was introduced                            through the mouth, and advanced to the second part                            of duodenum. The upper GI endoscopy was                            accomplished without difficulty. The patient                            tolerated the procedure well. Scope In: Scope Out: Findings:      Mucosal changes including longitudinal furrows were found in the entire       esophagus.      The exam of the esophagus was otherwise normal.      Patchy mildly erythematous mucosa without bleeding was found in the       gastric body, at the incisura, in the gastric antrum and in the       prepyloric region of the stomach.      Widely patent pylorus, likely accommodative in nature in setting of       duodenal bulbar stricture. The exam of the stomach was otherwise normal.      An acquired benign-appearing, intrinsic moderate stenosis was found in       the duodenal bulb and was traversed. A TTS dilator was passed through       the scope. Dilation with an 05-20-09 mm and a 07-23-11 mm pyloric balloon       dilator was performed under fluoroscopic guidance and guidewires. The       dilation site was examined and showed mild mucosal disruption.      Biopsies were taken with a cold forceps for histology of the duodenal       stricture.      The exam  of the duodenum was otherwise normal. Impression:               - Esophageal mucosal changes secondary to                            eosinophilic esophagitis.                           - Erythematous mucosa in the gastric body,                            incisura, antrum and prepyloric region of the                            stomach.                           - Acquired duodenal stenosis. Dilated.                           - Acquired duodenal  stenosis. Biopsied. Moderate Sedation:      None Recommendation:           - Patient has a contact number available for                            emergencies. The signs and symptoms of potential                            delayed complications were discussed with the                            patient. Return to normal activities tomorrow.                            Written discharge instructions were provided to the                            patient.                           - Discharge patient to home (ambulatory).                           - Soft diet indefinitely.                           - Continue present medications.                           - Avoid/Minimize NSAIDs.                           - Await pathology results.                           - Return to GI clinic in 4 months.                           -  Return to referring physician as previously                            scheduled. Procedure Code(s):        --- Professional ---                           [phone], Esophagogastroduodenoscopy, flexible,                            transoral; with dilation of gastric/duodenal                            stricture(s) (eg, balloon, bougie)                           43239, 59, Esophagogastroduodenoscopy, flexible,                            transoral; with biopsy, single or multiple Diagnosis Code(s):        --- Professional ---                           K20.0, Eosinophilic esophagitis                           K31.89, Other diseases of stomach and duodenum                           K31.5, Obstruction of duodenum                           R68.81, Early satiety CPT copyright 2022 [employer]. All rights reserved. The codes documented in this report are preliminary and upon coder review may  be revised to meet current compliance requirements. Willis Modena, MD 07/28/2023 10:01:48 AM This report has been signed electronically. Number of Addenda: 0

## 2023-07-28 NOTE — Transfer of Care (Signed)
Immediate Anesthesia Transfer of Care Note  Patient: Zachary Wade  Procedure(s) Performed: ESOPHAGOGASTRODUODENOSCOPY (EGD) WITH PROPOFOL (Bilateral) Duodenal BALLOON DILATION BIOPSY  Patient Location: PACU  Anesthesia Type:MAC  Level of Consciousness: drowsy  Airway & Oxygen Therapy: Patient Spontanous Breathing and Patient connected to face mask oxygen  Post-op Assessment: Report given to RN, Post -op Vital signs reviewed and stable, and Patient moving all extremities X 4  Post vital signs: Reviewed and stable  Last Vitals:  Vitals Value Taken Time  BP 105/66   Temp    Pulse 82   Resp 12   SpO2 99     Last Pain:  Vitals:   07/28/23 0813  TempSrc: Temporal  PainSc: 0-No pain         Complications: No notable events documented.

## 2023-07-29 ENCOUNTER — Encounter (HOSPITAL_COMMUNITY): Payer: Self-pay | Admitting: Gastroenterology

## 2023-07-29 LAB — SURGICAL PATHOLOGY

## 2023-08-04 NOTE — Plan of Care (Signed)
[facility] Tonsillectomy/Adenoidectomy, Postoperative PEDS care plan entered in error.

## 2024-01-20 ENCOUNTER — Other Ambulatory Visit: Payer: Self-pay | Admitting: Internal Medicine

## 2024-01-20 DIAGNOSIS — M545 Low back pain, unspecified: Secondary | ICD-10-CM

## 2024-11-03 ENCOUNTER — Encounter: Payer: Self-pay | Admitting: Cardiology

## 2024-11-03 ENCOUNTER — Ambulatory Visit: Attending: Cardiology | Admitting: Cardiology

## 2024-11-03 VITALS — BP 110/66 | HR 72 | Ht 70.0 in | Wt 165.6 lb

## 2024-11-03 DIAGNOSIS — I351 Nonrheumatic aortic (valve) insufficiency: Secondary | ICD-10-CM | POA: Insufficient documentation

## 2024-11-03 DIAGNOSIS — I517 Cardiomegaly: Secondary | ICD-10-CM | POA: Diagnosis not present

## 2024-11-03 DIAGNOSIS — E782 Mixed hyperlipidemia: Secondary | ICD-10-CM | POA: Diagnosis not present

## 2024-11-03 DIAGNOSIS — I10 Essential (primary) hypertension: Secondary | ICD-10-CM | POA: Insufficient documentation

## 2024-11-03 NOTE — Progress Notes (Signed)
 " Cardiology Office Note:  .   Date:  11/03/2024  ID:  Zachary Wade, DOB Jun 06, 1956, MRN [id number] PCP: Shayne Anes, MD  [facility] Providers Cardiologist:  Newman Lawrence, MD PCP: Shayne Anes, MD  Chief Complaint  Patient presents with   Establish Care    Mild left ventricular hypertrophy     Zachary Wade is a 69 y.o. male with hypertension, hyperlipidemia  Discussed the use of AI scribe software for clinical note transcription with the patient, who gave verbal consent to proceed.  History of Present Illness Zachary Wade is a 69 year old male who presents for follow-up on his cardiovascular health.  He feels well and notes nasal passage constriction with loud breathing only during activity, without nocturnal symptoms.  He remains physically active with walking and other exercise without chest pain or shortness of breath. He takes amlodipine 5 mg and ramipril 5 mg for blood pressure.  In 2019, an echocardiogram showed mild LVH, mild MR, AR, TR.    His cholesterol was slightly elevated in 2025. He has longstanding elevated cholesterol and is not on statin therapy due to concern for myalgias and liver issues.  He does not smoke. He works in [employer] and follows a generally [employer] with support from his wife.      Vitals:   11/03/24 0826  BP: 110/66  Pulse: 72  SpO2: 95%      Review of Systems  Cardiovascular:  Negative for chest pain, dyspnea on exertion, leg swelling, palpitations and syncope.        Studies Reviewed: SABRA        EKG 11/03/2024: Normal sinus rhythm Normal ECG When compared with ECG of 27-Nov-2008 06:59, No significant change was found     CCTA 2019: 1. Coronary artery calcium score 0 Agatston units, suggesting low risk for future cardiac events. 2.  No significant coronary disease noted on CT angiography.    Labs 01/2024: Chol 220, TG 92, HDL 68, LDL 134 Hb 14 Cr 0.8 0.5  Physical  Exam Vitals and nursing note reviewed.  Constitutional:      General: He is not in acute distress. Neck:     Vascular: No JVD.  Cardiovascular:     Rate and Rhythm: Normal rate and regular rhythm.     Heart sounds: Normal heart sounds. No murmur heard. Pulmonary:     Effort: Pulmonary effort is normal.     Breath sounds: Normal breath sounds. No wheezing or rales.  Musculoskeletal:     Right lower leg: No edema.     Left lower leg: No edema.      VISIT DIAGNOSES:   ICD-10-CM   1. Mixed hyperlipidemia  E78.2 CT CARDIAC SCORING (SELF PAY ONLY)    2. Primary hypertension  I10 ECHOCARDIOGRAM COMPLETE    3. Mild left ventricular hypertrophy  I51.7 EKG 12-Lead    4. Nonrheumatic aortic valve insufficiency  I35.1 ECHOCARDIOGRAM COMPLETE       Zachary Wade is a 69 y.o. male with hypertension, hyperlipidemia Assessment & Plan Mixed hyperlipidemia: Total cholesterol 220, LDL 134, HDL 68 in 01/2024. Not on statin due to personal preference and previously reassuring calcium score in 2019. As patient remains on the fence about statin therapy, recommend repeat cardiac scoring scan now for risk stratification.  Hypertension: Controlled on amlodipine and ramipril.  Previously noted to have mild LVH, mild AI, mild MR, mild TR on echocardiogram in 2019. Will repeat echocardiogram  now.     Further recommendations after above testing.  I will see him as needed, unless above testing show significant abnormalities.      Signed, Newman JINNY Lawrence, MD  "

## 2024-11-03 NOTE — Patient Instructions (Signed)
" °  Testing/Procedures: Echocardiogram  Your physician has requested that you have an echocardiogram. Echocardiography is a painless test that uses sound waves to create images of your heart. It provides your doctor with information about the size and shape of your heart and how well your hearts chambers and valves are working. This procedure takes approximately one hour. There are no restrictions for this procedure. Please do NOT wear cologne, perfume, aftershave, or lotions (deodorant is allowed). Please arrive 15 minutes prior to your appointment time.  Please note: We ask at that you not bring children with you during ultrasound (echo/ vascular) testing. Due to room size and safety concerns, children are not allowed in the ultrasound rooms during exams. Our front office staff cannot provide observation of children in our lobby area while testing is being conducted. An adult accompanying a patient to their appointment will only be allowed in the ultrasound room at the discretion of the ultrasound technician under special circumstances. We apologize for any inconvenience.   Calcium score   Your provider would like for you to have a Calcium Score CT. This test is painless. This is a non-contrast CT of the heat to look for calcified lesions in the coronary arteries. The cost of this is test cost $99 out of pocket and is not submitted to your insurance. The test can be performed at our [facility] Location in [place].  You can get the test scheduled in our office or you can call [phone].  Then press option 4, Then press option 2 Finally press option 2 and get your test scheduled.    Follow-Up: At [facility], you and your health needs are our priority.  As part of our continuing mission to provide you with exceptional heart care, our providers are all part of one team.  This team includes your primary Cardiologist (physician) and Advanced Practice Providers or APPs  (Physician Assistants and Nurse Practitioners) who all work together to provide you with the care you need, when you need it.  Your next appointment:   As needed   Provider:   Newman JINNY Lawrence, MD             "

## 2024-11-06 ENCOUNTER — Other Ambulatory Visit (HOSPITAL_COMMUNITY): Payer: Self-pay

## 2024-11-06 ENCOUNTER — Other Ambulatory Visit (HOSPITAL_COMMUNITY)

## 2024-11-06 ENCOUNTER — Ambulatory Visit (HOSPITAL_COMMUNITY): Payer: Self-pay

## 2024-11-09 ENCOUNTER — Ambulatory Visit (HOSPITAL_COMMUNITY)
Admission: RE | Admit: 2024-11-09 | Discharge: 2024-11-09 | Disposition: A | Discharge status: Home / Self Care | Payer: Self-pay | Source: Ambulatory Visit | Attending: Cardiology | Admitting: Cardiology

## 2024-11-09 DIAGNOSIS — E782 Mixed hyperlipidemia: Secondary | ICD-10-CM | POA: Insufficient documentation

## 2024-11-15 ENCOUNTER — Ambulatory Visit: Payer: Self-pay | Admitting: Cardiology

## 2024-11-15 DIAGNOSIS — E782 Mixed hyperlipidemia: Secondary | ICD-10-CM

## 2024-11-15 NOTE — Progress Notes (Signed)
 Mild coronary calcification noted, which is new since CT scan in 2019.  In the setting of elevated LDL, I do recommend heart healthy diet, lifestyle, and starting at least low-dose statin.  Recommend Crestor 10 or 20 mg daily-what ever patient would prefer, and repeat lipid lipid panel in 3 months, if patient is amenable to this.  Thanks MJP

## 2024-11-15 NOTE — Progress Notes (Signed)
 Noted.  Thanks MJP

## 2024-12-08 ENCOUNTER — Ambulatory Visit (HOSPITAL_COMMUNITY)
